# Patient Record
Sex: Male | Born: 1980 | Race: Black or African American | Hispanic: No | Marital: Single
Health system: Southern US, Community
[De-identification: ages and names within clinical notes are randomized; demographics above are authoritative.]

## PROBLEM LIST (undated history)

## (undated) DIAGNOSIS — I10 Essential (primary) hypertension: Secondary | ICD-10-CM

## (undated) DIAGNOSIS — W3400XA Accidental discharge from unspecified firearms or gun, initial encounter: Secondary | ICD-10-CM

## (undated) DIAGNOSIS — K5900 Constipation, unspecified: Secondary | ICD-10-CM

## (undated) DIAGNOSIS — R7303 Prediabetes: Secondary | ICD-10-CM

## (undated) HISTORY — DX: Constipation, unspecified: K59.00

## (undated) HISTORY — DX: Prediabetes: R73.03

## (undated) HISTORY — DX: Accidental discharge from unspecified firearms or gun, initial encounter: W34.00XA

---
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
## 2011-05-10 ENCOUNTER — Inpatient Hospital Stay (INDEPENDENT_AMBULATORY_CARE_PROVIDER_SITE_OTHER)
Admission: RE | Admit: 2011-05-10 | Discharge: 2011-05-10 | Disposition: A | Discharge status: Home / Self Care | Payer: Self-pay | Source: Ambulatory Visit | Attending: Emergency Medicine | Admitting: Emergency Medicine

## 2011-05-10 DIAGNOSIS — J029 Acute pharyngitis, unspecified: Secondary | ICD-10-CM

## 2011-05-10 DIAGNOSIS — J069 Acute upper respiratory infection, unspecified: Secondary | ICD-10-CM

## 2011-05-10 LAB — POCT RAPID STREP A: Streptococcus, Group A Screen (Direct): NEGATIVE

## 2016-12-30 ENCOUNTER — Encounter (HOSPITAL_COMMUNITY): Payer: Self-pay | Admitting: Emergency Medicine

## 2016-12-30 ENCOUNTER — Emergency Department (HOSPITAL_COMMUNITY)
Admission: EM | Admit: 2016-12-30 | Discharge: 2016-12-30 | Disposition: A | Discharge status: Home / Self Care | Payer: Self-pay | Attending: Emergency Medicine | Admitting: Emergency Medicine

## 2016-12-30 ENCOUNTER — Emergency Department (HOSPITAL_COMMUNITY): Payer: Self-pay

## 2016-12-30 DIAGNOSIS — F17200 Nicotine dependence, unspecified, uncomplicated: Secondary | ICD-10-CM | POA: Insufficient documentation

## 2016-12-30 DIAGNOSIS — Z720 Tobacco use: Secondary | ICD-10-CM

## 2016-12-30 DIAGNOSIS — R1110 Vomiting, unspecified: Secondary | ICD-10-CM | POA: Insufficient documentation

## 2016-12-30 DIAGNOSIS — R062 Wheezing: Secondary | ICD-10-CM | POA: Insufficient documentation

## 2016-12-30 DIAGNOSIS — J069 Acute upper respiratory infection, unspecified: Secondary | ICD-10-CM | POA: Insufficient documentation

## 2016-12-30 DIAGNOSIS — I10 Essential (primary) hypertension: Secondary | ICD-10-CM | POA: Insufficient documentation

## 2016-12-30 DIAGNOSIS — Z79899 Other long term (current) drug therapy: Secondary | ICD-10-CM | POA: Insufficient documentation

## 2016-12-30 HISTORY — DX: Essential (primary) hypertension: I10

## 2016-12-30 LAB — COMPREHENSIVE METABOLIC PANEL
ALBUMIN: 3.9 g/dL (ref 3.5–5.0)
ALT: 15 U/L — ABNORMAL LOW (ref 17–63)
ANION GAP: 10 (ref 5–15)
AST: 19 U/L (ref 15–41)
Alkaline Phosphatase: 73 U/L (ref 38–126)
BUN: 11 mg/dL (ref 6–20)
CO2: 27 mmol/L (ref 22–32)
Calcium: 9.2 mg/dL (ref 8.9–10.3)
Chloride: 102 mmol/L (ref 101–111)
Creatinine, Ser: 1.01 mg/dL (ref 0.61–1.24)
GFR calc Af Amer: >60 mL/min (ref >60)
GFR calc non Af Amer: >60 mL/min (ref >60)
GLUCOSE: 90 mg/dL (ref 65–99)
POTASSIUM: 3.6 mmol/L (ref 3.5–5.1)
SODIUM: 139 mmol/L (ref 135–145)
Total Bilirubin: 0.6 mg/dL (ref 0.3–1.2)
Total Protein: 7.9 g/dL (ref 6.5–8.1)

## 2016-12-30 LAB — CBC WITH DIFFERENTIAL/PLATELET
BASOS ABS: 0 10*3/uL (ref 0.0–0.1)
Basophils Relative: 0 %
EOS ABS: 0 10*3/uL (ref 0.0–0.7)
Eosinophils Relative: 0 %
HEMATOCRIT: 45.8 % (ref 39.0–52.0)
Hemoglobin: 15.3 g/dL (ref 13.0–17.0)
LYMPHS ABS: 3.3 10*3/uL (ref 0.7–4.0)
Lymphocytes Relative: 25 %
MCH: 29.9 pg (ref 26.0–34.0)
MCHC: 33.4 g/dL (ref 30.0–36.0)
MCV: 89.5 fL (ref 78.0–100.0)
MONOS PCT: 9 %
Monocytes Absolute: 1.2 10*3/uL — ABNORMAL HIGH (ref 0.1–1.0)
NEUTROS ABS: 8.8 10*3/uL — AB (ref 1.7–7.7)
Neutrophils Relative %: 66 %
PLATELETS: 311 10*3/uL (ref 150–400)
RBC: 5.12 MIL/uL (ref 4.22–5.81)
RDW: 14.5 % (ref 11.5–15.5)
WBC: 13.3 10*3/uL — ABNORMAL HIGH (ref 4.0–10.5)

## 2016-12-30 LAB — LIPASE, BLOOD: Lipase: 29 U/L (ref 11–51)

## 2016-12-30 LAB — I-STAT CG4 LACTIC ACID, ED: Lactic Acid, Venous: 1.22 mmol/L (ref 0.5–1.9)

## 2016-12-30 MED ORDER — SODIUM CHLORIDE 0.9 % IV BOLUS (SEPSIS)
1000.0000 mL | Freq: Once | INTRAVENOUS | Status: AC
  Administered 2016-12-30: 1000 mL via INTRAVENOUS

## 2016-12-30 MED ORDER — HYDROCOD POLST-CPM POLST ER 10-8 MG/5ML PO SUER
5.0000 mL | Freq: Once | ORAL | Status: AC
  Administered 2016-12-30: 5 mL via ORAL
  Filled 2016-12-30: qty 5

## 2016-12-30 MED ORDER — HYDROCODONE-ACETAMINOPHEN 5-325 MG PO TABS: ORAL_TABLET | ORAL | 0 refills | Status: DC

## 2016-12-30 MED ORDER — ALBUTEROL SULFATE (2.5 MG/3ML) 0.083% IN NEBU
5.0000 mg | INHALATION_SOLUTION | Freq: Once | RESPIRATORY_TRACT | Status: AC
  Administered 2016-12-30: 5 mg via RESPIRATORY_TRACT
  Filled 2016-12-30: qty 6

## 2016-12-30 MED ORDER — IBUPROFEN 400 MG PO TABS
400.0000 mg | ORAL_TABLET | Freq: Once | ORAL | Status: AC
  Administered 2016-12-30: 400 mg via ORAL
  Filled 2016-12-30: qty 1

## 2016-12-30 MED ORDER — ALBUTEROL SULFATE HFA 108 (90 BASE) MCG/ACT IN AERS
2.0000 | INHALATION_SPRAY | Freq: Once | RESPIRATORY_TRACT | Status: AC
  Administered 2016-12-30: 2 via RESPIRATORY_TRACT
  Filled 2016-12-30: qty 6.7

## 2016-12-30 NOTE — ED Triage Notes (Signed)
Pt arrives from home reporting productive cough x 5 days, fever/chills, sore throat and otalgia.  Pt reports n/v x 4 days.  Pt denies any family members with similar symptoms.

## 2016-12-30 NOTE — ED Notes (Signed)
IV removed.

## 2016-12-30 NOTE — ED Notes (Signed)
Ice water at bedside. Pt encouraged to drink.

## 2016-12-30 NOTE — ED Provider Notes (Signed)
[facility] DEPT Provider Note   CSN: [id number] Arrival date & time: 12/30/16  0707     History   Chief Complaint Chief Complaint  Patient presents with  . Cough  . Emesis    HPI   Blood pressure (!) 162/127, pulse 100, temperature 99.5 F (37.5 C), temperature source Oral, resp. rate (!) 31, height 6\' 1"  (1.854 m), weight 108.9 kg (240 lb), SpO2 93 %.  Derrick Duckingony M Sedgwick Jr. is a 36 y.o. male with past medical history significant for tobacco use and hypertension complaining of productive cough with shortness of breath wheezing, rhinorrhea, left otalgia, tactile fever, nausea and vomiting with no myalgia, diarrhea, abdominal pain. Symptoms started 4 days ago. Patient denies sick contacts. States that he vomited 7 times yesterday and he states that the emesis is posttussive.   Past Medical History:  Diagnosis Date  . Hypertension     There are no active problems to display for this patient.   History reviewed. No pertinent surgical history.     Home Medications    Prior to Admission medications   Medication Sig Start Date End Date Taking? Authorizing Provider  HYDROcodone-acetaminophen (NORCO/VICODIN) 5-325 MG tablet Take 1-2 tablets by mouth every 6 hours as needed for pain and/or cough. 12/30/16   Amilah Greenspan, Mardella LaymanNicole, PA-C    Family History No family history on file.  Social History Social History  Substance Use Topics  . Smoking status: Current Every Day Smoker    Packs/day: 2.00  . Smokeless tobacco: Never Used  . Alcohol use Yes     Comment: "I drink about a gallon of liquor most nights"     Allergies   Patient has no allergy information on record.   Review of Systems Review of Systems  10 systems reviewed and found to be negative, except as noted in the HPI.   Physical Exam Updated Vital Signs BP (!) 149/72 (BP Location: Left Arm)   Pulse 94   Temp 98.3 F (36.8 C) (Oral)   Resp 20   Ht 6\' 1"  (1.854 m)   Wt 108.9 kg (240 lb)   SpO2 99%    BMI 31.66 kg/m   Physical Exam  Constitutional: He appears well-developed and well-nourished.  HENT:  Head: Normocephalic.  Right Ear: External ear normal.  Left Ear: External ear normal.  Mouth/Throat: Oropharynx is clear and moist. No oropharyngeal exudate.  No drooling or stridor. Posterior pharynx mildly erythematous no significant tonsillar hypertrophy. No exudate. Soft palate rises symmetrically. No TTP or induration under tongue.   No tenderness to palpation of frontal or bilateral maxillary sinuses.  Mild mucosal edema in the nares with scant rhinorrhea.  Bilateral tympanic membranes with normal architecture and good light reflex. Left TM with very mild injection    Eyes: Conjunctivae and EOM are normal. Pupils are equal, round, and reactive to light.  Neck: Normal range of motion. Neck supple.  Cardiovascular: Normal rate and regular rhythm.   Pulmonary/Chest: Effort normal. No stridor. No respiratory distress. He has wheezes. He has no rales. He exhibits no tenderness.  Trace scattered expiratory wheezing  Abdominal: Soft. There is no tenderness. There is no rebound and no guarding.  Nursing note and vitals reviewed.    ED Treatments / Results  Labs (all labs ordered are listed, but only abnormal results are displayed) Labs Reviewed  CBC WITH DIFFERENTIAL/PLATELET - Abnormal; Notable for the following:       Result Value   WBC 13.3 (*)    Neutro  Abs 8.8 (*)    Monocytes Absolute 1.2 (*)    All other components within normal limits  COMPREHENSIVE METABOLIC PANEL - Abnormal; Notable for the following:    ALT 15 (*)    All other components within normal limits  LIPASE, BLOOD  I-STAT CG4 LACTIC ACID, ED    EKG  EKG Interpretation  Date/Time:  Monday December 30 2016 07:48:10 EDT Ventricular Rate:  91 PR Interval:    QRS Duration: 110 QT Interval:  352 QTC Calculation: 433 R Axis:   83 Text Interpretation:  Sinus rhythm Multiple premature complexes, vent &  supraven Probable left ventricular hypertrophy agree, no old comparison Confirmed by Arby Barrette [phone]) on 12/30/2016 8:34:17 AM       Radiology Dg Chest 2 View  Result Date: 12/30/2016 CLINICAL DATA:  Fever and cough for 4 days EXAM: CHEST  2 VIEW COMPARISON:  None. FINDINGS: Lungs are clear. Heart size and pulmonary vascularity are normal. No adenopathy. No bone lesions. IMPRESSION: No edema or consolidation. Electronically Signed   By: Bretta Bang III M.D.   On: 12/30/2016 09:17    Procedures Procedures (including critical care time)  Medications Ordered in ED Medications  albuterol (PROVENTIL HFA;VENTOLIN HFA) 108 (90 Base) MCG/ACT inhaler 2 puff (not administered)  albuterol (PROVENTIL) (2.5 MG/3ML) 0.083% nebulizer solution 5 mg (5 mg Nebulization Given 12/30/16 0801)  sodium chloride 0.9 % bolus 1,000 mL (0 mLs Intravenous Stopped 12/30/16 0938)  ibuprofen (ADVIL,MOTRIN) tablet 400 mg (400 mg Oral Given 12/30/16 0839)  chlorpheniramine-HYDROcodone (TUSSIONEX) 10-8 MG/5ML suspension 5 mL (5 mLs Oral Given 12/30/16 6045)     Initial Impression / Assessment and Plan / ED Course  I have reviewed the triage vital signs and the nursing notes.  Pertinent labs & imaging results that were available during my care of the patient were reviewed by me and considered in my medical decision making (see chart for details).     Vitals:   12/30/16 0830 12/30/16 0845 12/30/16 0948 12/30/16 1009  BP:  (!) 134/99 (!) 142/78 (!) 149/72  Pulse: 98 96 91 94  Resp: (!) 32 18 20 20   Temp:    98.3 F (36.8 C)  TempSrc:    Oral  SpO2: 95% 92% 92% 99%  Weight:      Height:        Medications  albuterol (PROVENTIL HFA;VENTOLIN HFA) 108 (90 Base) MCG/ACT inhaler 2 puff (not administered)  albuterol (PROVENTIL) (2.5 MG/3ML) 0.083% nebulizer solution 5 mg (5 mg Nebulization Given 12/30/16 0801)  sodium chloride 0.9 % bolus 1,000 mL (0 mLs Intravenous Stopped 12/30/16 0938)  ibuprofen  (ADVIL,MOTRIN) tablet 400 mg (400 mg Oral Given 12/30/16 0839)  chlorpheniramine-HYDROcodone (TUSSIONEX) 10-8 MG/5ML suspension 5 mL (5 mLs Oral Given 12/30/16 0838)    Suleyman Ehrman. is 36 y.o. male presenting with Cough, pleuritic chest pain, wheezing, otalgia and subjective fever. He also has multiple episodes of posttussive emesis. No respiratory distress. Vital signs with a mild tachypnea. Lung sounds improved after nebulizer treatment. Chest x-rays without infiltrate. Blood work reassuring with mild leukocytosis and normal lactic acid. Likely viral syndrome setting off reactive airway and posttussive emesis. Patient feels much better after hydration and Tussionex. Counseled him to control fever with ibuprofen and work note provided.  Extensive counseling on smoking cessation. Patient is given resource guide to establish primary care.  Evaluation does not show pathology that would require ongoing emergent intervention or inpatient treatment. Pt is hemodynamically stable and mentating appropriately.  Discussed findings and plan with patient/guardian, who agrees with care plan. All questions answered. Return precautions discussed and outpatient follow up given.    Final Clinical Impressions(s) / ED Diagnoses   Final diagnoses:  Upper respiratory tract infection, unspecified type  Wheezes  Post-tussive emesis  Tobacco use    New Prescriptions New Prescriptions   HYDROCODONE-ACETAMINOPHEN (NORCO/VICODIN) 5-325 MG TABLET    Take 1-2 tablets by mouth every 6 hours as needed for pain and/or cough.     Kaylyn Lim 12/30/16 1018    Arby Barrette, MD 12/31/16 [phone]

## 2016-12-30 NOTE — Discharge Instructions (Signed)

## 2017-01-06 ENCOUNTER — Emergency Department (HOSPITAL_COMMUNITY)
Admission: EM | Admit: 2017-01-06 | Discharge: 2017-01-06 | Disposition: A | Discharge status: Home / Self Care | Payer: Self-pay | Attending: Emergency Medicine | Admitting: Emergency Medicine

## 2017-01-06 ENCOUNTER — Encounter (HOSPITAL_COMMUNITY): Payer: Self-pay | Admitting: Emergency Medicine

## 2017-01-06 ENCOUNTER — Emergency Department (HOSPITAL_COMMUNITY): Payer: Self-pay

## 2017-01-06 DIAGNOSIS — Y92194 Driveway of other specified residential institution as the place of occurrence of the external cause: Secondary | ICD-10-CM | POA: Insufficient documentation

## 2017-01-06 DIAGNOSIS — S39012A Strain of muscle, fascia and tendon of lower back, initial encounter: Secondary | ICD-10-CM

## 2017-01-06 DIAGNOSIS — Y9389 Activity, other specified: Secondary | ICD-10-CM | POA: Insufficient documentation

## 2017-01-06 DIAGNOSIS — Y999 Unspecified external cause status: Secondary | ICD-10-CM | POA: Insufficient documentation

## 2017-01-06 DIAGNOSIS — I10 Essential (primary) hypertension: Secondary | ICD-10-CM | POA: Insufficient documentation

## 2017-01-06 DIAGNOSIS — F17200 Nicotine dependence, unspecified, uncomplicated: Secondary | ICD-10-CM | POA: Insufficient documentation

## 2017-01-06 DIAGNOSIS — Z79899 Other long term (current) drug therapy: Secondary | ICD-10-CM | POA: Insufficient documentation

## 2017-01-06 MED ORDER — OXYCODONE-ACETAMINOPHEN 5-325 MG PO TABS
1.0000 | ORAL_TABLET | Freq: Once | ORAL | Status: AC
  Administered 2017-01-06: 1 via ORAL
  Filled 2017-01-06: qty 1

## 2017-01-06 MED ORDER — OXYCODONE-ACETAMINOPHEN 5-325 MG PO TABS: 1.0000 | ORAL_TABLET | ORAL | 0 refills | Status: DC | PRN

## 2017-01-06 NOTE — ED Notes (Signed)
Pt c/o pain in left hip, left lumbar,left buttocks and left leg.

## 2017-01-06 NOTE — ED Notes (Signed)
ED Provider at bedside. 
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
## 2017-01-06 NOTE — Discharge Instructions (Signed)
Use ice on the sore area 3-4 times a day for 2 days, then heat.  See the doctor of your choice, as needed, for problems.
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
## 2017-09-22 ENCOUNTER — Encounter (HOSPITAL_COMMUNITY): Payer: Self-pay | Admitting: Emergency Medicine

## 2017-09-22 ENCOUNTER — Ambulatory Visit (HOSPITAL_COMMUNITY)
Admission: EM | Admit: 2017-09-22 | Discharge: 2017-09-22 | Disposition: A | Discharge status: Home / Self Care | Payer: Self-pay | Attending: Emergency Medicine | Admitting: Emergency Medicine

## 2017-09-22 ENCOUNTER — Other Ambulatory Visit: Payer: Self-pay

## 2017-09-22 DIAGNOSIS — F17210 Nicotine dependence, cigarettes, uncomplicated: Secondary | ICD-10-CM | POA: Insufficient documentation

## 2017-09-22 DIAGNOSIS — I10 Essential (primary) hypertension: Secondary | ICD-10-CM | POA: Insufficient documentation

## 2017-09-22 DIAGNOSIS — J029 Acute pharyngitis, unspecified: Secondary | ICD-10-CM | POA: Insufficient documentation

## 2017-09-22 LAB — POCT RAPID STREP A: STREPTOCOCCUS, GROUP A SCREEN (DIRECT): NEGATIVE

## 2017-09-22 MED ORDER — DEXAMETHASONE SODIUM PHOSPHATE 10 MG/ML IJ SOLN
10.0000 mg | Freq: Once | INTRAMUSCULAR | Status: AC
  Administered 2017-09-22: 10 mg via INTRAMUSCULAR

## 2017-09-22 MED ORDER — DEXAMETHASONE SODIUM PHOSPHATE 10 MG/ML IJ SOLN
INTRAMUSCULAR | Status: AC
  Filled 2017-09-22: qty 1

## 2017-09-22 MED ORDER — IBUPROFEN 600 MG PO TABS: 600.0000 mg | ORAL_TABLET | Freq: Four times a day (QID) | ORAL | 0 refills | Status: DC | PRN

## 2017-09-22 MED ORDER — AMOXICILLIN-POT CLAVULANATE 875-125 MG PO TABS: 1.0000 | ORAL_TABLET | Freq: Two times a day (BID) | ORAL | 0 refills | Status: DC

## 2017-09-22 NOTE — ED Provider Notes (Signed)
HPI  SUBJECTIVE:  Derrick Hull. is a 37 y.o. male who presents with 3 days of sore throat, chills, nasal congestion, rhinorrhea, body aches.  States that he feels like his throat is swelling shut.  He denies difficulty breathing, drooling, trismus.  He denies fevers, postnasal drip, cough, headache, rash, abdominal pain, allergy or GERD symptoms.  He states that his voice sounds a little more muffled than usual.  He tried Tylenol and an unknown cold medication without improvement in symptoms.  Symptoms are worse with swallowing, eating.  He states that his roommate has similar symptoms and was treated with an antibiotic.  Is not sure if his roommate is positive for strep.  No known exposure to mono.  He did also have a left upper wisdom tooth pulled 3 days prior to the symptoms starting, but he has been fine since then.  He has a past medical history of hypertension.  No history of diabetes, mono, recurrent strep.  PMD: None.    Past Medical History:  Diagnosis Date  . Hypertension     History reviewed. No pertinent surgical history.  Family History  Problem Relation Age of Onset  . Hypertension Mother     Social History   Tobacco Use  . Smoking status: Current Every Day Smoker    Packs/day: 2.00  . Smokeless tobacco: Never Used  Substance Use Topics  . Alcohol use: Yes    Comment: "I drink about a gallon of liquor most nights"  . Drug use: Yes    Frequency: 2.0 times per week    Types: Marijuana, Cocaine    No current facility-administered medications for this encounter.   Current Outpatient Medications:  .  amoxicillin-clavulanate (AUGMENTIN) 875-125 MG tablet, Take 1 tablet by mouth 2 (two) times daily., Disp: 20 tablet, Rfl: 0 .  ibuprofen (ADVIL,MOTRIN) 600 MG tablet, Take 1 tablet (600 mg total) by mouth every 6 (six) hours as needed., Disp: 30 tablet, Rfl: 0  No Known Allergies   ROS  As noted in HPI.   Physical Exam  BP (!) 149/63 (BP Location: Left Arm)  Comment (BP Location): large cuff  Pulse (!) 101   Temp 99.3 F (37.4 C) (Oral)   Resp (!) 24   SpO2 99%   Constitutional: Well developed, well nourished, no acute distress Eyes:  EOMI, conjunctiva normal bilaterally HENT: Normocephalic, atraumatic,mucus membranes moist.  No drooling, trismus.  Positive tender surgical site left upper wisdom tooth area.  Positive erythema oropharynx.  Tonsils enlarged on the left more so than the right.  No exudates.  Uvula midline.  No soft palate tenderness.  Neck: Positive cervical lymphadenopathy  Respiratory: Normal inspiratory effort Cardiovascular: Regular tachycardia no murmurs GI: nondistended, no splenomegaly skin: No rash, skin intact Musculoskeletal: no deformities Neurologic: Alert & oriented x 3, no focal neuro deficits Psychiatric: Speech and behavior appropriate   ED Course   Medications  dexamethasone (DECADRON) injection 10 mg (10 mg Intramuscular Given 09/22/17 1826)    Orders Placed This Encounter  Procedures  . Culture, group A strep    Standing Status:   Standing    Number of Occurrences:   1  . POCT rapid strep A [facility])    Standing Status:   Standing    Number of Occurrences:   1    No results found for this or any previous visit (from the past 24 hour(s)). No results found.  ED Clinical Impression  Acute pharyngitis, unspecified etiology  ED Assessment/Plan  Rapid strep negative, however because of the recent dental work, this could still be an early peritonsillar abscess versus dental abscess.  Does not appear to be anything to drain at this point in time.  Giving shot of dexamethasone 10 mg IM for the tonsillar swelling.  Will send home with Augmentin for 10 days will cover both early peritonsillar abscess and dental infection.  He will return here in 36-48 hours if he still has a sore throat, gave patient strict ER return precautions.  Discussed labs, MDM, plan and followup with patient. Discussed  sn/sx that should prompt return to the ED. patient agrees with plan.   Meds ordered this encounter  Medications  . dexamethasone (DECADRON) injection 10 mg  . amoxicillin-clavulanate (AUGMENTIN) 875-125 MG tablet    Sig: Take 1 tablet by mouth 2 (two) times daily.    Dispense:  20 tablet    Refill:  0  . ibuprofen (ADVIL,MOTRIN) 600 MG tablet    Sig: Take 1 tablet (600 mg total) by mouth every 6 (six) hours as needed.    Dispense:  30 tablet    Refill:  0    *This clinic note was created using Scientist, clinical (histocompatibility and immunogenetics)Dragon dictation software. Therefore, there may be occasional mistakes despite careful proofreading.   ?   Domenick GongMortenson, Erubiel Manasco, MD 09/24/17 1339

## 2017-09-22 NOTE — ED Triage Notes (Signed)
Onset of symptoms was 3 days ago. Symptoms include chills, hot flashes, headache, chest soreness, sore throat and minimal cough

## 2017-09-22 NOTE — Discharge Instructions (Signed)
Follow-up here if not getting better in 36-48 hours.  Go immediately to the ER if you get worse.  1 gram of Tylenol and 600 mg ibuprofen together 3-4 times a day as needed for pain.  Make sure you drink plenty of extra fluids.  Some people find salt water gargles and  Traditional Medicinal's "Throat Coat" tea helpful. Take 5 mL of liquid Benadryl and 5 mL of Maalox. Mix it together, and then hold it in your mouth for as long as you can and then swallow. You may do this 4 times a day.  Up with a primary care physician of your choice for routine care.  See list below.  Below is a list of primary care practices who are taking new patients for you to follow-up with. [facility] [address] [place] [address] [phone]  [facility] [phone] [address] [place] [address]  [facility]: [address] [place] [id number]  [phone]  [facility]: [address] [place] [id number]   [phone]  [facility]: [address] [place] [address]  [phone]  [facility] : [address] [place] [address] [phone]  [facility]: [address]  [place] [phone]  [facility]: [address]  [place] [id number] [phone]  Dr. Oneal GroutMahima Pandey [address] [facility] [address] [facility] [place] [id number]  [phone]  Dr. Jackie PlumGeorge Osei-Bonsu, [facility]. [address] [place], [place] [address]  [phone]  Go to www.goodrx.com to look up your medications. This will give you a list of where you can find your prescriptions at the most affordable prices. Or ask the pharmacist what the cash price is, or if they have any  other discount programs available to help make your medication more affordable. This can be less expensive than what you would pay with insurance.

## 2017-09-24 LAB — CULTURE, GROUP A STREP (THRC)

## 2017-10-14 ENCOUNTER — Other Ambulatory Visit: Payer: Self-pay

## 2017-10-14 ENCOUNTER — Encounter (HOSPITAL_COMMUNITY): Payer: Self-pay | Admitting: Emergency Medicine

## 2017-10-14 ENCOUNTER — Ambulatory Visit (HOSPITAL_COMMUNITY)
Admission: EM | Admit: 2017-10-14 | Discharge: 2017-10-14 | Disposition: A | Discharge status: Home / Self Care | Payer: Self-pay | Attending: Family Medicine | Admitting: Family Medicine

## 2017-10-14 DIAGNOSIS — Z202 Contact with and (suspected) exposure to infections with a predominantly sexual mode of transmission: Secondary | ICD-10-CM

## 2017-10-14 DIAGNOSIS — Z113 Encounter for screening for infections with a predominantly sexual mode of transmission: Secondary | ICD-10-CM

## 2017-10-14 DIAGNOSIS — F17210 Nicotine dependence, cigarettes, uncomplicated: Secondary | ICD-10-CM | POA: Insufficient documentation

## 2017-10-14 DIAGNOSIS — A599 Trichomoniasis, unspecified: Secondary | ICD-10-CM | POA: Insufficient documentation

## 2017-10-14 DIAGNOSIS — Z711 Person with feared health complaint in whom no diagnosis is made: Secondary | ICD-10-CM

## 2017-10-14 DIAGNOSIS — I10 Essential (primary) hypertension: Secondary | ICD-10-CM | POA: Insufficient documentation

## 2017-10-14 DIAGNOSIS — Z8249 Family history of ischemic heart disease and other diseases of the circulatory system: Secondary | ICD-10-CM | POA: Insufficient documentation

## 2017-10-14 MED ORDER — METRONIDAZOLE 500 MG PO TABS: 500.0000 mg | ORAL_TABLET | Freq: Two times a day (BID) | ORAL | 0 refills | Status: DC

## 2017-10-14 NOTE — ED Triage Notes (Signed)
[facility] with a complaint of being exposed to Trichomonas. The patient reported that his partner was diagnosed with the same. The patient reported dysuria previously but currently no symptoms. Dirty urine sample collected.

## 2017-10-14 NOTE — Discharge Instructions (Addendum)
We have sent testing for sexually transmitted infections. We will notify you of any positive results once they are received. If required, we will prescribe any medications you might need.  Please refrain from all sexual activity for at least the next seven days.  

## 2017-10-15 LAB — URINE CYTOLOGY ANCILLARY ONLY
Chlamydia: NEGATIVE
NEISSERIA GONORRHEA: NEGATIVE
TRICH (WINDOWPATH): NEGATIVE

## 2017-10-15 NOTE — ED Provider Notes (Signed)
[facility]   [id number] 10/14/17 Arrival Time: 1824  ASSESSMENT & PLAN:  1. Concern about STD in male without diagnosis   2. Trichomoniasis     Meds ordered this encounter  Medications  . metroNIDAZOLE (FLAGYL) 500 MG tablet    Sig: Take 1 tablet (500 mg total) by mouth 2 (two) times daily.    Dispense:  14 tablet    Refill:  0   Pending: Labs Reviewed  URINE CYTOLOGY ANCILLARY ONLY   Will notify of any positive results. Instructed to refrain from sexual activity for at least seven days.  Reviewed expectations re: course of current medical issues. Questions answered. Outlined signs and symptoms indicating need for more acute intervention. Patient verbalized understanding. After Visit Summary given.   SUBJECTIVE:  Derrick Duckingony M Grenfell Jr. is a 37 y.o. male who presents reporting that his male sexual partner was dx with trich. Desires treatment and testing. He is without symptoms. Afebrile. No abdominal or pelvic pain. No n/v. No rashes or lesions.  ROS: As per HPI.  OBJECTIVE:  Vitals:   10/14/17 1933  BP: (!) 149/91  Pulse: 85  Resp: 18  Temp: 98.3 F (36.8 C)  TempSrc: Oral  SpO2: 98%     General appearance: alert, cooperative, appears stated age and no distress Throat: lips, mucosa, and tongue normal; teeth and gums normal Abdomen: soft, non-tender Skin: warm and dry Psychological:  Alert and cooperative. Normal mood and affect.   Labs Reviewed  URINE CYTOLOGY ANCILLARY ONLY   No Known Allergies  Past Medical History:  Diagnosis Date  . Hypertension    Family History  Problem Relation Age of Onset  . Hypertension Mother    Social History   Socioeconomic History  . Marital status: Single    Spouse name: Not on file  . Number of children: Not on file  . Years of education: Not on file  . Highest education level: Not on file  Occupational History  . Not on file  Social Needs  . Financial resource strain: Not on file  . Food  insecurity:    Worry: Not on file    Inability: Not on file  . Transportation needs:    Medical: Not on file    Non-medical: Not on file  Tobacco Use  . Smoking status: Current Every Day Smoker    Packs/day: 2.00  . Smokeless tobacco: Never Used  Substance and Sexual Activity  . Alcohol use: Yes    Comment: "I drink about a gallon of liquor most nights"  . Drug use: Yes    Frequency: 2.0 times per week    Types: Marijuana, Cocaine  . Sexual activity: Not on file  Lifestyle  . Physical activity:    Days per week: Not on file    Minutes per session: Not on file  . Stress: Not on file  Relationships  . Social connections:    Talks on phone: Not on file    Gets together: Not on file    Attends religious service: Not on file    Active member of club or organization: Not on file    Attends meetings of clubs or organizations: Not on file    Relationship status: Not on file  . Intimate partner violence:    Fear of current or ex partner: Not on file    Emotionally abused: Not on file    Physically abused: Not on file    Forced sexual activity: Not on file  Other  Topics Concern  . Not on file  Social History Narrative  . Not on file          Mardella Layman, MD 10/15/17 [phone]

## 2018-09-14 IMAGING — DX DG CHEST 2V
2 series · 2 of 2 positions shown · non-contrast
Comparison: None.

CLINICAL DATA: Fever and cough for 4 days

EXAM:
CHEST  2 VIEW

[w chest pa]
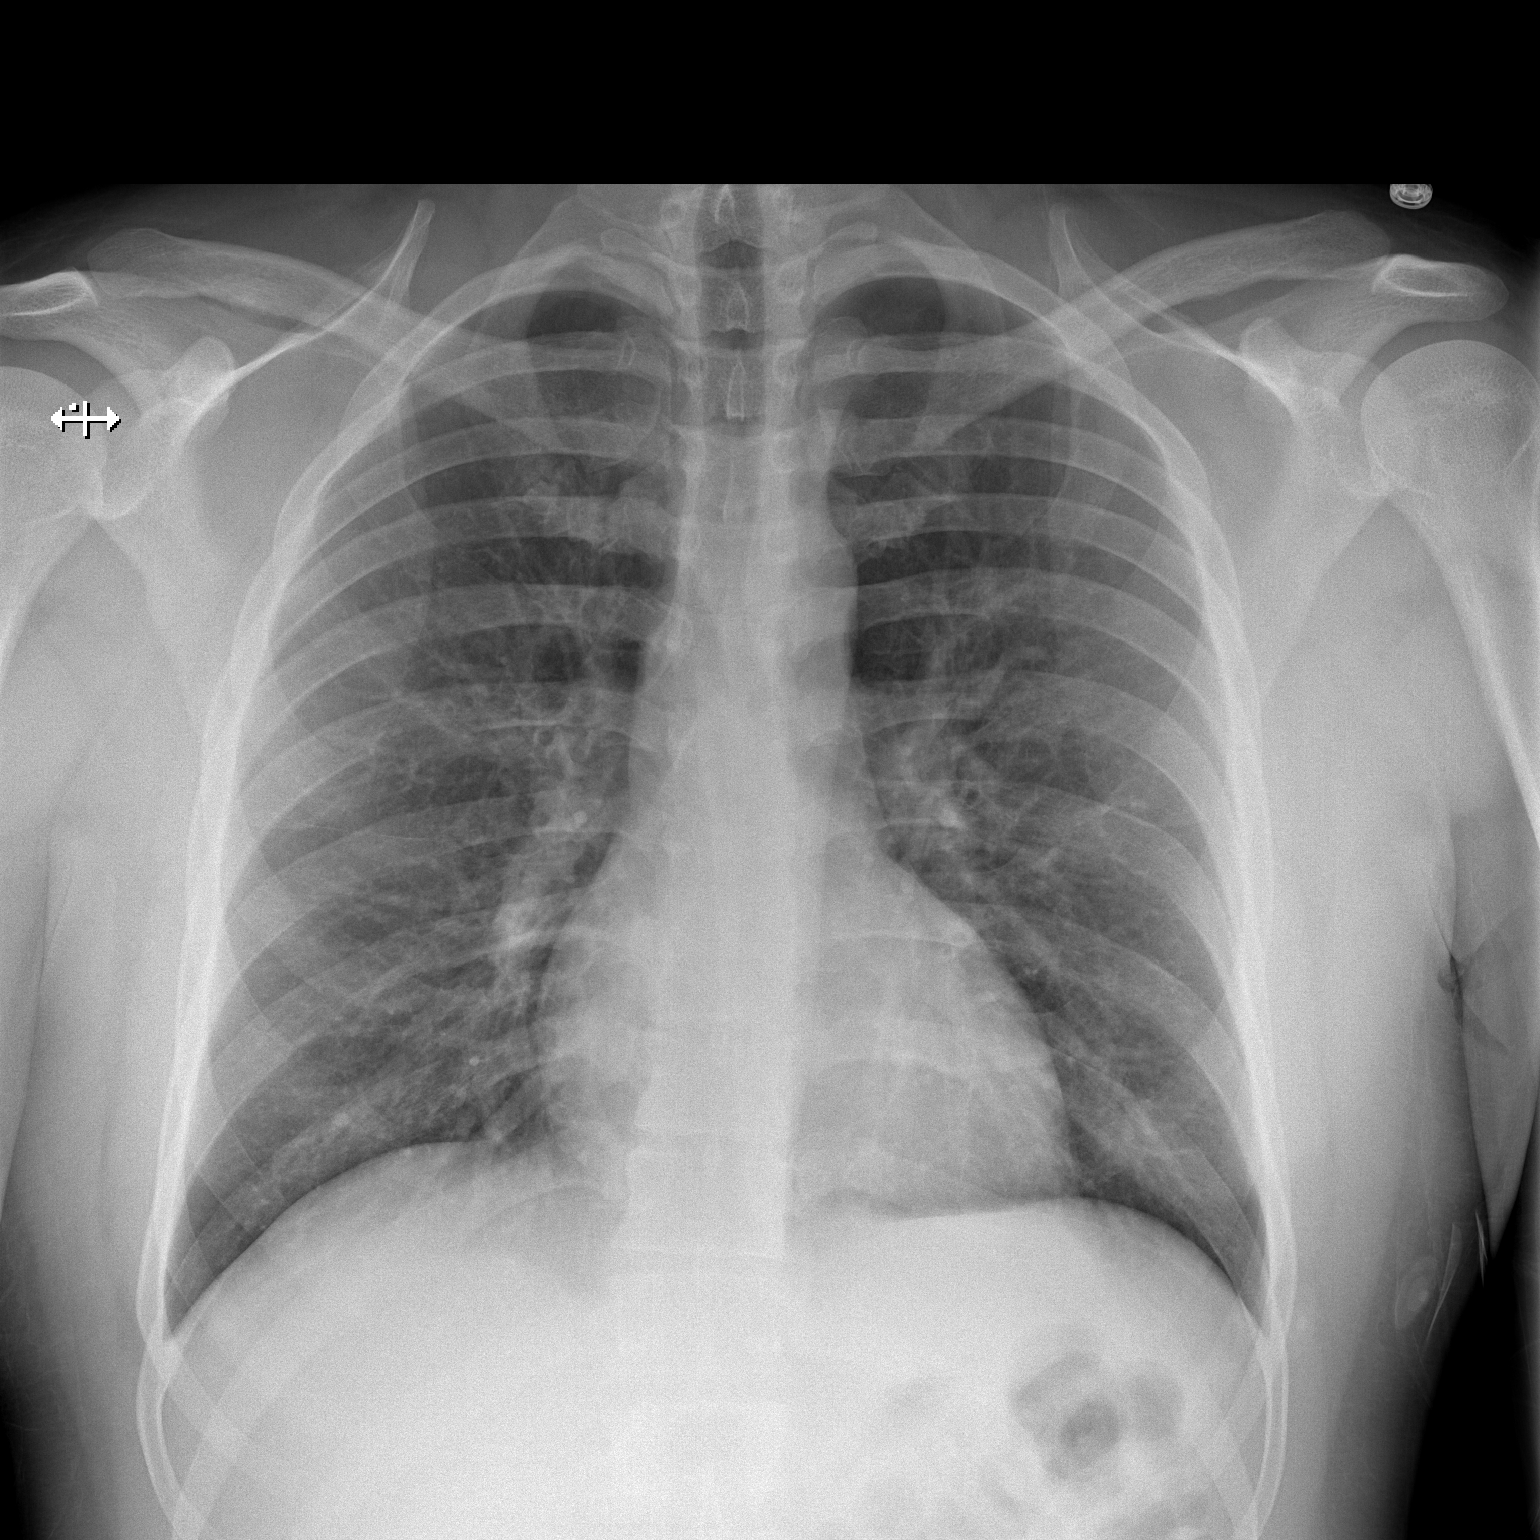

[w chest lat]
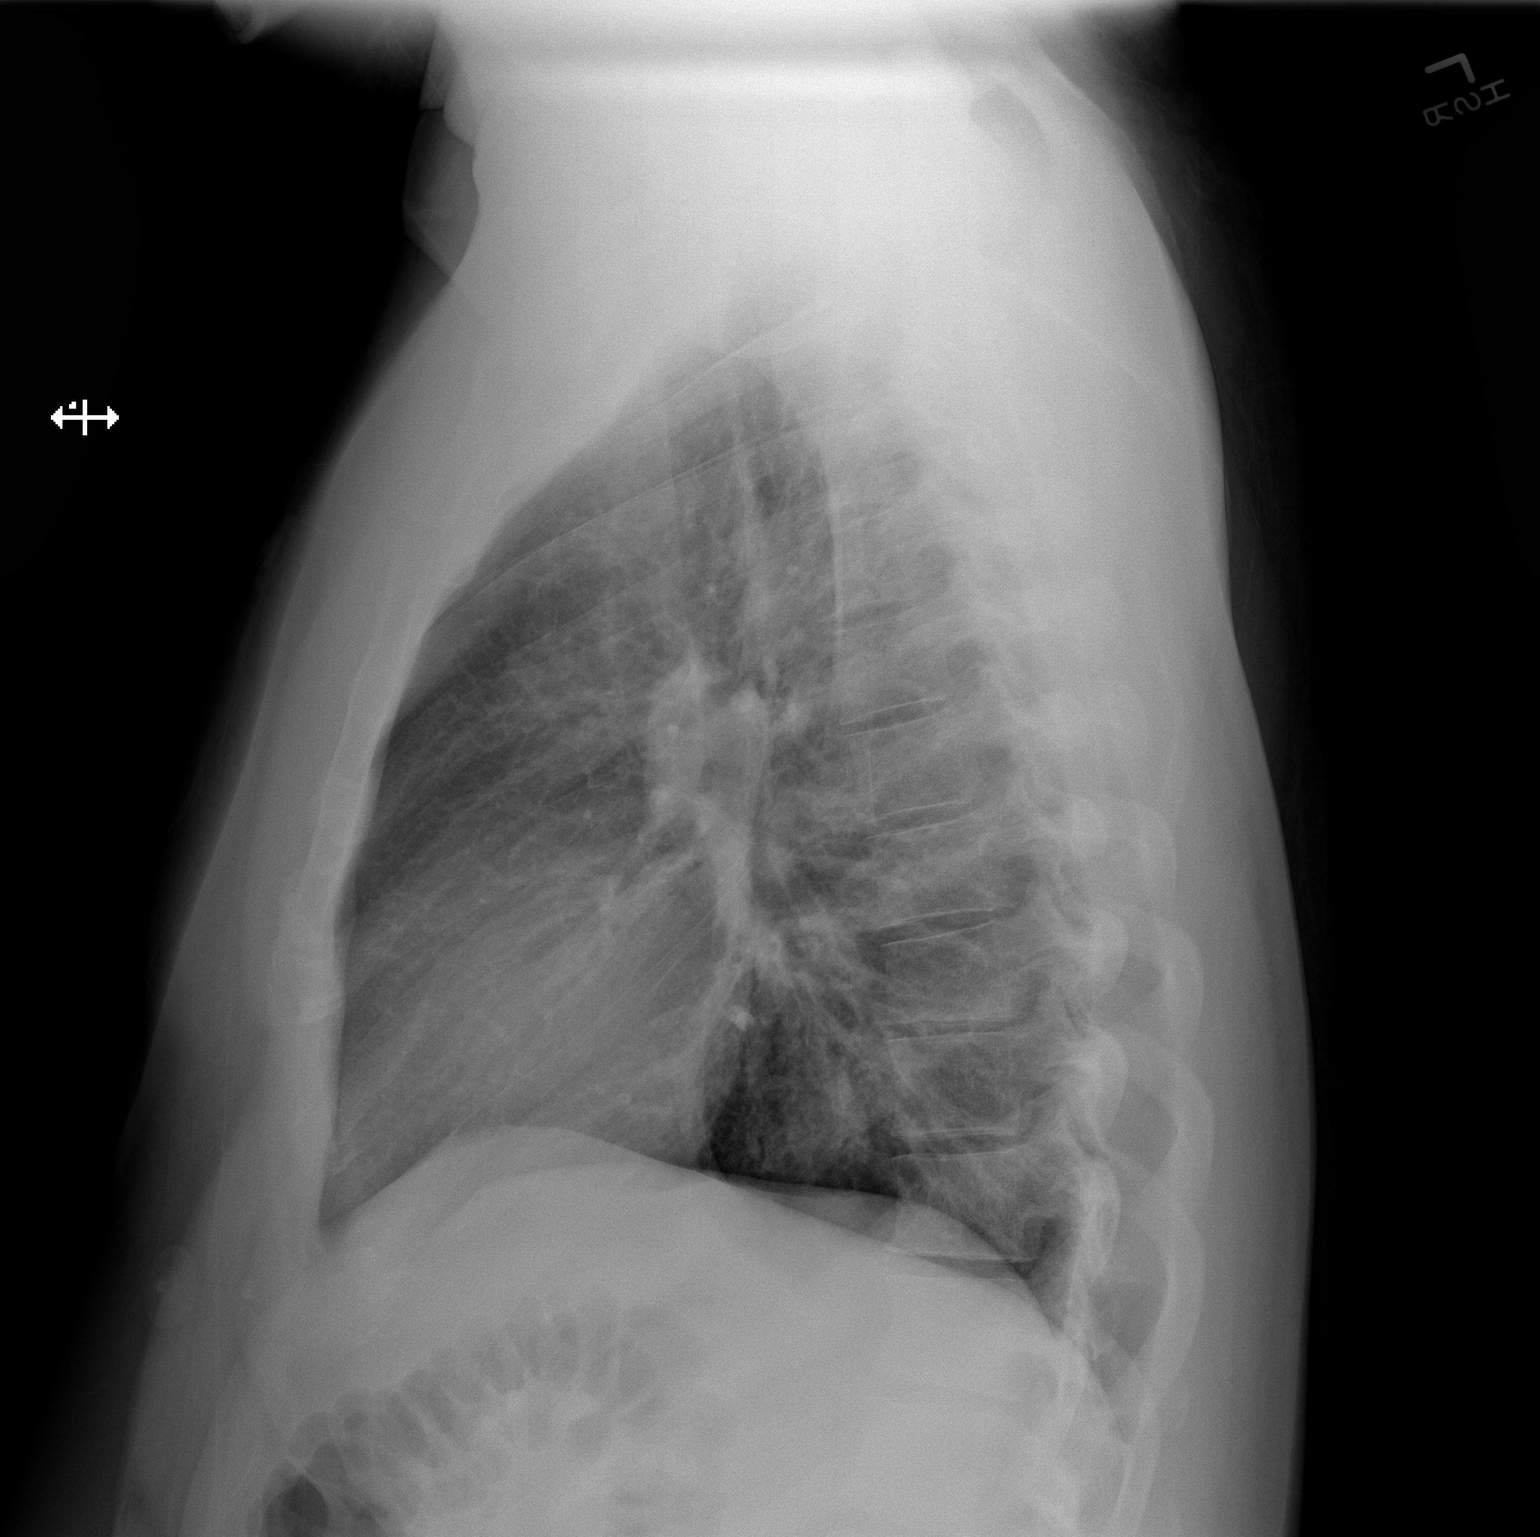

[2 of 2 positions shown; findings below may reference images not displayed]

FINDINGS: Lungs are clear. Heart size and pulmonary vascularity are normal. No
adenopathy. No bone lesions.
IMPRESSION: No edema or consolidation.

## 2020-05-16 ENCOUNTER — Emergency Department (HOSPITAL_COMMUNITY)
Admission: EM | Admit: 2020-05-16 | Discharge: 2020-05-16 | Disposition: A | Discharge status: Home / Self Care | Payer: No Typology Code available for payment source | Attending: Emergency Medicine | Admitting: Emergency Medicine

## 2020-05-16 ENCOUNTER — Emergency Department (HOSPITAL_COMMUNITY): Payer: No Typology Code available for payment source

## 2020-05-16 ENCOUNTER — Other Ambulatory Visit: Payer: Self-pay

## 2020-05-16 ENCOUNTER — Encounter (HOSPITAL_COMMUNITY): Payer: Self-pay

## 2020-05-16 DIAGNOSIS — F17200 Nicotine dependence, unspecified, uncomplicated: Secondary | ICD-10-CM | POA: Diagnosis not present

## 2020-05-16 DIAGNOSIS — I10 Essential (primary) hypertension: Secondary | ICD-10-CM | POA: Diagnosis not present

## 2020-05-16 DIAGNOSIS — S3992XA Unspecified injury of lower back, initial encounter: Secondary | ICD-10-CM | POA: Diagnosis present

## 2020-05-16 DIAGNOSIS — S39012A Strain of muscle, fascia and tendon of lower back, initial encounter: Secondary | ICD-10-CM | POA: Insufficient documentation

## 2020-05-16 MED ORDER — ORPHENADRINE CITRATE ER 100 MG PO TB12: 100.0000 mg | ORAL_TABLET | Freq: Two times a day (BID) | ORAL | 0 refills | Status: AC

## 2020-05-16 MED ORDER — NAPROXEN 500 MG PO TABS: 500.0000 mg | ORAL_TABLET | Freq: Two times a day (BID) | ORAL | 0 refills | Status: AC

## 2020-05-16 NOTE — ED Notes (Signed)
Reviewed discharge instructions with patient. Follow-up care and medications reviewed. Patient  verbalized understanding. Patient A&Ox4, VSS, and ambulatory with steady gait upon discharge.  °

## 2020-05-16 NOTE — Discharge Instructions (Addendum)
Follow up with your primary care provider. Take Naproxen as needed as prescribed for pain. Take Norflex as needed as prescribed for muscle spasm. Do not drive or operate machinery if taking Norflex. Warm compresses to sore muscles followed by gentle stretching.
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
## 2020-05-22 DEATH — deceased

## 2021-11-21 ENCOUNTER — Emergency Department (HOSPITAL_COMMUNITY)
Admission: EM | Admit: 2021-11-21 | Discharge: 2021-11-22 | Disposition: A | Discharge status: Home / Self Care | Payer: Medicaid Other | Attending: Emergency Medicine | Admitting: Emergency Medicine

## 2021-11-21 ENCOUNTER — Other Ambulatory Visit: Payer: Self-pay

## 2021-11-21 DIAGNOSIS — D72829 Elevated white blood cell count, unspecified: Secondary | ICD-10-CM | POA: Diagnosis not present

## 2021-11-21 DIAGNOSIS — H6123 Impacted cerumen, bilateral: Secondary | ICD-10-CM | POA: Insufficient documentation

## 2021-11-21 DIAGNOSIS — Z20822 Contact with and (suspected) exposure to covid-19: Secondary | ICD-10-CM | POA: Insufficient documentation

## 2021-11-21 DIAGNOSIS — I10 Essential (primary) hypertension: Secondary | ICD-10-CM | POA: Insufficient documentation

## 2021-11-21 DIAGNOSIS — J069 Acute upper respiratory infection, unspecified: Secondary | ICD-10-CM | POA: Diagnosis not present

## 2021-11-21 DIAGNOSIS — J029 Acute pharyngitis, unspecified: Secondary | ICD-10-CM

## 2021-11-21 DIAGNOSIS — R059 Cough, unspecified: Secondary | ICD-10-CM | POA: Diagnosis present

## 2021-11-21 NOTE — ED Triage Notes (Signed)
Pt reports with sore throat, swollen tonsils, and cough x 3 weeks.  ?

## 2021-11-22 ENCOUNTER — Emergency Department (HOSPITAL_COMMUNITY): Payer: Medicaid Other

## 2021-11-22 ENCOUNTER — Telehealth (HOSPITAL_COMMUNITY): Payer: Self-pay | Admitting: Physician Assistant

## 2021-11-22 ENCOUNTER — Encounter (HOSPITAL_COMMUNITY): Payer: Self-pay

## 2021-11-22 LAB — CBC WITH DIFFERENTIAL/PLATELET
Abs Immature Granulocytes: 0.07 10*3/uL (ref 0.00–0.07)
Basophils Absolute: 0.1 10*3/uL (ref 0.0–0.1)
Basophils Relative: 1 %
Eosinophils Absolute: 0 10*3/uL (ref 0.0–0.5)
Eosinophils Relative: 0 %
HCT: 51.2 % (ref 39.0–52.0)
Hemoglobin: 16.7 g/dL (ref 13.0–17.0)
Immature Granulocytes: 1 %
Lymphocytes Relative: 24 %
Lymphs Abs: 3.5 10*3/uL (ref 0.7–4.0)
MCH: 28.8 pg (ref 26.0–34.0)
MCHC: 32.6 g/dL (ref 30.0–36.0)
MCV: 88.3 fL (ref 80.0–100.0)
Monocytes Absolute: 1.1 10*3/uL — ABNORMAL HIGH (ref 0.1–1.0)
Monocytes Relative: 8 %
Neutro Abs: 10 10*3/uL — ABNORMAL HIGH (ref 1.7–7.7)
Neutrophils Relative %: 66 %
Platelets: 386 10*3/uL (ref 150–400)
RBC: 5.8 MIL/uL (ref 4.22–5.81)
RDW: 14.6 % (ref 11.5–15.5)
WBC: 14.8 10*3/uL — ABNORMAL HIGH (ref 4.0–10.5)
nRBC: 0 % (ref 0.0–0.2)

## 2021-11-22 LAB — RESP PANEL BY RT-PCR (FLU A&B, COVID) ARPGX2
Influenza A by PCR: NEGATIVE
Influenza B by PCR: NEGATIVE
SARS Coronavirus 2 by RT PCR: NEGATIVE

## 2021-11-22 LAB — I-STAT CHEM 8, ED
BUN: 12 mg/dL (ref 6–20)
Calcium, Ion: 1.16 mmol/L (ref 1.15–1.40)
Chloride: 99 mmol/L (ref 98–111)
Creatinine, Ser: 1.1 mg/dL (ref 0.61–1.24)
Glucose, Bld: 114 mg/dL — ABNORMAL HIGH (ref 70–99)
HCT: 52 % (ref 39.0–52.0)
Hemoglobin: 17.7 g/dL — ABNORMAL HIGH (ref 13.0–17.0)
Potassium: 4.2 mmol/L (ref 3.5–5.1)
Sodium: 141 mmol/L (ref 135–145)
TCO2: 35 mmol/L — ABNORMAL HIGH (ref 22–32)

## 2021-11-22 LAB — GROUP A STREP BY PCR: Group A Strep by PCR: NOT DETECTED

## 2021-11-22 MED ORDER — AMOXICILLIN-POT CLAVULANATE 875-125 MG PO TABS: 1.0000 | ORAL_TABLET | Freq: Two times a day (BID) | ORAL | 0 refills | Status: DC

## 2021-11-22 MED ORDER — SODIUM CHLORIDE 0.9 % IV BOLUS
1000.0000 mL | Freq: Once | INTRAVENOUS | Status: AC
  Administered 2021-11-22: 1000 mL via INTRAVENOUS

## 2021-11-22 MED ORDER — METHYLPREDNISOLONE SODIUM SUCC 125 MG IJ SOLR
125.0000 mg | Freq: Once | INTRAMUSCULAR | Status: AC
  Administered 2021-11-22: 125 mg via INTRAVENOUS
  Filled 2021-11-22: qty 2

## 2021-11-22 MED ORDER — SODIUM CHLORIDE (PF) 0.9 % IJ SOLN
INTRAMUSCULAR | Status: AC
  Filled 2021-11-22: qty 50

## 2021-11-22 MED ORDER — IOHEXOL 300 MG/ML  SOLN
75.0000 mL | Freq: Once | INTRAMUSCULAR | Status: AC | PRN
  Administered 2021-11-22: 75 mL via INTRAVENOUS

## 2021-11-22 MED ORDER — AMOXICILLIN-POT CLAVULANATE 875-125 MG PO TABS
1.0000 | ORAL_TABLET | Freq: Once | ORAL | Status: AC
  Administered 2021-11-22: 1 via ORAL
  Filled 2021-11-22: qty 1

## 2021-11-22 NOTE — ED Notes (Signed)
Pt requesting antibiotic prescription be resent to [facility] on [address]. The [facility] on [address] does not have his script, AVS shows pt was prescribed an antibiotic. [place], [place] states he will resend the script for antibiotics to [facility] on [address].  ?

## 2021-11-22 NOTE — ED Provider Notes (Signed)
?[facility]-EMERGENCY DEPT ?Provider Note ? ? ?CSN: [id number] ?Arrival date & time: 11/21/21  2331 ? ?  ? ?History ? ?Chief Complaint  ?Patient presents with  ? Sore Throat  ? Cough  ? ? ?Derrick Hull. is a 41 y.o. male. ? ?41 year old male presents today for evaluation of cough, sore throat of 3-week duration that is worsened recently involving hoarseness of his voice, difficulty swallowing.  He states he is able to tolerate his own secretions however is becoming increasingly difficult to to maintain his secretions.  Endorses subjective fevers at home.  Last p.o. intake 2 hours ago.  Endorses productive sputum. ? ?The history is provided by the patient. No language interpreter was used.  ? ?  ? ?Home Medications ?Prior to Admission medications   ?Medication Sig Start Date End Date Taking? Authorizing Provider  ?naproxen (NAPROSYN) 500 MG tablet Take 1 tablet (500 mg total) by mouth 2 (two) times daily. 05/16/20   Jeannie Fend, PA-C  ?orphenadrine (NORFLEX) 100 MG tablet Take 1 tablet (100 mg total) by mouth 2 (two) times daily. 05/16/20   Jeannie Fend, PA-C  ?   ? ?Allergies    ?Patient has no known allergies.   ? ?Review of Systems   ?Review of Systems  ?Constitutional:  Positive for chills and fever.  ?HENT:  Positive for congestion, sore throat, trouble swallowing and voice change. Negative for drooling.   ?Respiratory:  Positive for cough. Negative for shortness of breath.   ?Cardiovascular:  Negative for chest pain.  ?Gastrointestinal:  Positive for nausea and vomiting.  ?Neurological:  Negative for weakness and light-headedness.  ?All other systems reviewed and are negative. ? ?Physical Exam ?Updated Vital Signs ?BP (!) 142/109   Pulse 98   Temp 99.1 ?F (37.3 ?C) (Oral)   Resp 17   Ht 6\' 2"  (1.88 m)   Wt 116.1 kg   SpO2 93%   BMI 32.87 kg/m?  ?Physical Exam ?Vitals and nursing note reviewed.  ?Constitutional:   ?   General: He is not in acute distress. ?   Appearance:  Normal appearance. He is not ill-appearing.  ?HENT:  ?   Head: Normocephalic and atraumatic.  ?   Right Ear: There is impacted cerumen.  ?   Left Ear: There is impacted cerumen.  ?   Nose: Nose normal.  ?   Mouth/Throat:  ?   Pharynx: Uvula midline. Posterior oropharyngeal erythema present. No oropharyngeal exudate.  ?   Comments: Uniform tonsillar swelling present.  Uvula midline.  No evidence of retropharyngeal abscess or peritonsillar abscess. ?Eyes:  ?   General: No scleral icterus. ?   Extraocular Movements: Extraocular movements intact.  ?   Conjunctiva/sclera: Conjunctivae normal.  ?Cardiovascular:  ?   Rate and Rhythm: Normal rate and regular rhythm.  ?   Pulses: Normal pulses.  ?   Heart sounds: Normal heart sounds.  ?Pulmonary:  ?   Effort: Pulmonary effort is normal. No respiratory distress.  ?   Breath sounds: Normal breath sounds. No wheezing or rales.  ?Abdominal:  ?   General: There is no distension.  ?   Tenderness: There is no abdominal tenderness.  ?Musculoskeletal:     ?   General: Normal range of motion.  ?   Cervical back: Normal range of motion.  ?Skin: ?   General: Skin is warm and dry.  ?Neurological:  ?   General: No focal deficit present.  ?   Mental Status:  He is alert. Mental status is at baseline.  ? ? ?ED Results / Procedures / Treatments   ?Labs ?(all labs ordered are listed, but only abnormal results are displayed) ?Labs Reviewed  ?GROUP A STREP BY PCR  ?RESP PANEL BY RT-PCR (FLU A&B, COVID) ARPGX2  ?CBC WITH DIFFERENTIAL/PLATELET  ?I-STAT CHEM 8, ED  ? ? ?EKG ?None ? ?Radiology ?No results found. ? ?Procedures ?Procedures  ? ? ?Medications Ordered in ED ?Medications  ?sodium chloride 0.9 % bolus 1,000 mL (has no administration in time range)  ? ? ?ED Course/ Medical Decision Making/ A&P ?  ?                        ?Medical Decision Making ?Amount and/or Complexity of Data Reviewed ?Labs: ordered. ?Radiology: ordered. ? ?Risk ?Prescription drug management. ? ? ?Medical Decision Making  / ED Course ? ? ?This patient presents to the ED for concern of sore throat, cough, this involves an extensive number of treatment options, and is a complaint that carries with it a high risk of complications and morbidity.  The differential diagnosis includes pneumonia, retropharyngeal abscess, Ludwick's angina, peritonsillar abscess, tonsillitis, strep pharyngitis, URI ? ?MDM: ?41 year old male presents today for evaluation of sore throat, cough of the past 3 weeks that has been significantly worsening as of late including hoarseness, difficulty swallowing, fever.  Tolerating his own secretions.  Last p.o. intake 2 hours ago.  Will evaluate with CT soft tissue neck, CBC, i-STAT chemistry panel, and chest x-ray.  Will provide IV fluids in the meantime.  Patient on exam appears to be comfortable however does have hoarseness.  ?CT soft tissue neck shows tonsillopharyngitis otherwise no concern for abscess.  Chest x-ray negative for pneumonia or other acute findings.  CBC shows leukocytosis.  Renal function preserved.  COVID and flu negative.  Strep negative.  Will discharge on Augmentin.  Will provide dose of Augmentin and Solu-Medrol in the emergency room.  He has been tolerating p.o. intake in the emergency room.  Patient is ? ? ?Lab Tests: ?-I ordered, reviewed, and interpreted labs.   ?The pertinent results include:   ?Labs Reviewed  ?GROUP A STREP BY PCR  ?RESP PANEL BY RT-PCR (FLU A&B, COVID) ARPGX2  ?CBC WITH DIFFERENTIAL/PLATELET  ?I-STAT CHEM 8, ED  ?  ? ? ?EKG ? EKG Interpretation ? ?Date/Time:    ?Ventricular Rate:    ?PR Interval:    ?QRS Duration:   ?QT Interval:    ?QTC Calculation:   ?R Axis:     ?Text Interpretation:   ?  ? ?  ? ? ? ?Imaging Studies ordered: ?I ordered imaging studies including CT soft tissue neck, chest x-ray ?I independently visualized and interpreted imaging. ?I agree with the radiologist interpretation ? ? ?Medicines ordered and prescription drug management: ?Meds ordered this  encounter  ?Medications  ? sodium chloride 0.9 % bolus 1,000 mL  ?  ?-I have reviewed the patients home medicines and have made adjustments as needed ? ?Reevaluation: ?After the interventions noted above, I reevaluated the patient and found that they have :improved ? ?Co morbidities that complicate the patient evaluation ? ?Past Medical History:  ?Diagnosis Date  ? Hypertension   ?  ? ? ?Dispostion: ?Patient is appropriate for discharge.  Discharged in stable condition.  Return precautions discussed. ? ?Final Clinical Impression(s) / ED Diagnoses ?Final diagnoses:  ?Tonsillopharyngitis  ? ? ?Rx / DC Orders ?ED Discharge Orders   ? ?  Ordered  ?  amoxicillin-clavulanate (AUGMENTIN) 875-125 MG tablet  Every 12 hours       ? 11/22/21 0248  ? ?  ?  ? ?  ? ? ?  ?Marita Kansasli, Dechelle Attaway, PA-C ?11/22/21 [id number] ? ?  ?Dione BoozeGlick, David, MD ?11/22/21 [id number] ? ?

## 2021-11-22 NOTE — Telephone Encounter (Signed)
Patient did not get Augmentin sent to the pharmacy and called the emergency department today requesting that the Augmentin be resent to the pharmacy. ?

## 2021-11-22 NOTE — Discharge Instructions (Signed)
Your work-up today showed you have inflammation of your tonsils and pharynx.  No evidence of abscess.  You received first dose of antibiotic in the emergency room.  You received a dose of steroids prior to discharge.  I have sent additional antibiotics to the pharmacy for you.  If you have any worsening symptoms including inability to swallow, drooling, uncontrolled fever, please return to the emergency room. ?

## 2021-11-22 NOTE — ED Notes (Signed)
Pt tolerating oral intake.

## 2023-01-25 ENCOUNTER — Emergency Department (HOSPITAL_COMMUNITY): Payer: Medicaid Other

## 2023-01-25 ENCOUNTER — Encounter (HOSPITAL_COMMUNITY): Admission: EM | Disposition: A | Discharge status: Home / Self Care | Payer: Self-pay | Source: Home / Self Care

## 2023-01-25 ENCOUNTER — Other Ambulatory Visit: Payer: Self-pay

## 2023-01-25 ENCOUNTER — Inpatient Hospital Stay (HOSPITAL_COMMUNITY): Payer: Medicaid Other

## 2023-01-25 ENCOUNTER — Inpatient Hospital Stay (HOSPITAL_COMMUNITY)
Admission: EM | Admit: 2023-01-25 | Discharge: 2023-02-07 | DRG: 329 | Disposition: A | Discharge status: Home / Self Care | Payer: Medicaid Other | Attending: Surgery | Admitting: Surgery

## 2023-01-25 ENCOUNTER — Encounter (HOSPITAL_COMMUNITY): Payer: Self-pay | Admitting: Emergency Medicine

## 2023-01-25 ENCOUNTER — Inpatient Hospital Stay (HOSPITAL_COMMUNITY): Payer: Medicaid Other | Admitting: Certified Registered Nurse Anesthetist

## 2023-01-25 DIAGNOSIS — J9601 Acute respiratory failure with hypoxia: Secondary | ICD-10-CM | POA: Diagnosis not present

## 2023-01-25 DIAGNOSIS — S36531A Laceration of transverse colon, initial encounter: Secondary | ICD-10-CM | POA: Diagnosis present

## 2023-01-25 DIAGNOSIS — K567 Ileus, unspecified: Secondary | ICD-10-CM | POA: Diagnosis not present

## 2023-01-25 DIAGNOSIS — F1490 Cocaine use, unspecified, uncomplicated: Secondary | ICD-10-CM | POA: Diagnosis present

## 2023-01-25 DIAGNOSIS — S31149A Puncture wound of abdominal wall with foreign body, unspecified quadrant without penetration into peritoneal cavity, initial encounter: Secondary | ICD-10-CM | POA: Diagnosis not present

## 2023-01-25 DIAGNOSIS — E876 Hypokalemia: Secondary | ICD-10-CM | POA: Diagnosis present

## 2023-01-25 DIAGNOSIS — W3400XA Accidental discharge from unspecified firearms or gun, initial encounter: Secondary | ICD-10-CM | POA: Diagnosis not present

## 2023-01-25 DIAGNOSIS — Z9889 Other specified postprocedural states: Secondary | ICD-10-CM | POA: Diagnosis not present

## 2023-01-25 DIAGNOSIS — F430 Acute stress reaction: Secondary | ICD-10-CM | POA: Diagnosis not present

## 2023-01-25 DIAGNOSIS — Z23 Encounter for immunization: Secondary | ICD-10-CM | POA: Diagnosis not present

## 2023-01-25 DIAGNOSIS — Y249XXA Unspecified firearm discharge, undetermined intent, initial encounter: Secondary | ICD-10-CM

## 2023-01-25 DIAGNOSIS — F32A Depression, unspecified: Secondary | ICD-10-CM | POA: Diagnosis present

## 2023-01-25 DIAGNOSIS — S36591A Other injury of transverse colon, initial encounter: Secondary | ICD-10-CM | POA: Diagnosis not present

## 2023-01-25 DIAGNOSIS — F4310 Post-traumatic stress disorder, unspecified: Secondary | ICD-10-CM | POA: Diagnosis present

## 2023-01-25 DIAGNOSIS — F419 Anxiety disorder, unspecified: Secondary | ICD-10-CM | POA: Diagnosis present

## 2023-01-25 DIAGNOSIS — I10 Essential (primary) hypertension: Secondary | ICD-10-CM | POA: Diagnosis present

## 2023-01-25 DIAGNOSIS — R339 Retention of urine, unspecified: Secondary | ICD-10-CM | POA: Diagnosis not present

## 2023-01-25 DIAGNOSIS — S36498A Other injury of other part of small intestine, initial encounter: Secondary | ICD-10-CM | POA: Diagnosis present

## 2023-01-25 DIAGNOSIS — S31139A Puncture wound of abdominal wall without foreign body, unspecified quadrant without penetration into peritoneal cavity, initial encounter: Secondary | ICD-10-CM | POA: Diagnosis present

## 2023-01-25 DIAGNOSIS — Z6837 Body mass index (BMI) 37.0-37.9, adult: Secondary | ICD-10-CM | POA: Diagnosis not present

## 2023-01-25 HISTORY — PX: LAPAROTOMY: SHX154

## 2023-01-25 LAB — I-STAT CHEM 8, ED
BUN: 14 mg/dL (ref 6–20)
Calcium, Ion: 1.02 mmol/L — ABNORMAL LOW (ref 1.15–1.40)
Chloride: 106 mmol/L (ref 98–111)
Creatinine, Ser: 1 mg/dL (ref 0.61–1.24)
Glucose, Bld: 142 mg/dL — ABNORMAL HIGH (ref 70–99)
HCT: 51 % (ref 39.0–52.0)
Hemoglobin: 17.3 g/dL — ABNORMAL HIGH (ref 13.0–17.0)
Potassium: 3.6 mmol/L (ref 3.5–5.1)
Sodium: 141 mmol/L (ref 135–145)
TCO2: 24 mmol/L (ref 22–32)

## 2023-01-25 LAB — PROTIME-INR
INR: 1.1 (ref 0.8–1.2)
Prothrombin Time: 13.9 seconds (ref 11.4–15.2)

## 2023-01-25 LAB — CBC
HCT: 48.8 % (ref 39.0–52.0)
Hemoglobin: 16 g/dL (ref 13.0–17.0)
MCH: 28.6 pg (ref 26.0–34.0)
MCHC: 32.8 g/dL (ref 30.0–36.0)
MCV: 87.1 fL (ref 80.0–100.0)
Platelets: 343 10*3/uL (ref 150–400)
RBC: 5.6 MIL/uL (ref 4.22–5.81)
RDW: 14.6 % (ref 11.5–15.5)
WBC: 8.4 10*3/uL (ref 4.0–10.5)
nRBC: 0 % (ref 0.0–0.2)

## 2023-01-25 LAB — COMPREHENSIVE METABOLIC PANEL
ALT: 17 U/L (ref 0–44)
AST: 20 U/L (ref 15–41)
Albumin: 4 g/dL (ref 3.5–5.0)
Alkaline Phosphatase: 65 U/L (ref 38–126)
Anion gap: 17 — ABNORMAL HIGH (ref 5–15)
BUN: 12 mg/dL (ref 6–20)
CO2: 22 mmol/L (ref 22–32)
Calcium: 9.1 mg/dL (ref 8.9–10.3)
Chloride: 102 mmol/L (ref 98–111)
Creatinine, Ser: 1.08 mg/dL (ref 0.61–1.24)
GFR, Estimated: >60 mL/min (ref >60)
Glucose, Bld: 142 mg/dL — ABNORMAL HIGH (ref 70–99)
Potassium: 3.6 mmol/L (ref 3.5–5.1)
Sodium: 141 mmol/L (ref 135–145)
Total Bilirubin: 0.7 mg/dL (ref 0.3–1.2)
Total Protein: 7.6 g/dL (ref 6.5–8.1)

## 2023-01-25 LAB — ABO/RH: ABO/RH(D): O POS

## 2023-01-25 LAB — ETHANOL: Alcohol, Ethyl (B): <10 mg/dL (ref <10)

## 2023-01-25 LAB — TYPE AND SCREEN
ABO/RH(D): O POS
Antibody Screen: NEGATIVE

## 2023-01-25 LAB — LACTIC ACID, PLASMA: Lactic Acid, Venous: 2.9 mmol/L (ref 0.5–1.9)

## 2023-01-25 SURGERY — LAPAROTOMY, EXPLORATORY
Anesthesia: General

## 2023-01-25 MED ORDER — 0.9 % SODIUM CHLORIDE (POUR BTL) OPTIME
TOPICAL | Status: DC | PRN
  Administered 2023-01-25: 1000 mL

## 2023-01-25 MED ORDER — HYDROMORPHONE HCL 1 MG/ML IJ SOLN
INTRAMUSCULAR | Status: DC | PRN
  Administered 2023-01-25 (×2): 1 mg via INTRAVENOUS

## 2023-01-25 MED ORDER — ACETAMINOPHEN 500 MG PO TABS
1000.0000 mg | ORAL_TABLET | Freq: Four times a day (QID) | ORAL | Status: DC
  Administered 2023-01-26 – 2023-02-06 (×45): 1000 mg
  Filled 2023-01-25 (×48): qty 2

## 2023-01-25 MED ORDER — MIDAZOLAM HCL 2 MG/2ML IJ SOLN
INTRAMUSCULAR | Status: DC | PRN
  Administered 2023-01-25 (×2): 2 mg via INTRAVENOUS

## 2023-01-25 MED ORDER — PROPOFOL 10 MG/ML IV BOLUS
INTRAVENOUS | Status: DC | PRN
  Administered 2023-01-25: 50 mg via INTRAVENOUS
  Administered 2023-01-25: 150 mg via INTRAVENOUS

## 2023-01-25 MED ORDER — PROPOFOL 1000 MG/100ML IV EMUL
INTRAVENOUS | Status: AC
  Filled 2023-01-25: qty 100

## 2023-01-25 MED ORDER — ROCURONIUM BROMIDE 10 MG/ML (PF) SYRINGE
PREFILLED_SYRINGE | INTRAVENOUS | Status: DC | PRN
  Administered 2023-01-25 (×2): 50 mg via INTRAVENOUS

## 2023-01-25 MED ORDER — METHOCARBAMOL 1000 MG/10ML IJ SOLN
500.0000 mg | Freq: Three times a day (TID) | INTRAVENOUS | Status: DC
  Filled 2023-01-25 (×4): qty 5

## 2023-01-25 MED ORDER — FENTANYL CITRATE (PF) 250 MCG/5ML IJ SOLN
INTRAMUSCULAR | Status: DC | PRN
  Administered 2023-01-25: 150 ug via INTRAVENOUS
  Administered 2023-01-25: 100 ug via INTRAVENOUS

## 2023-01-25 MED ORDER — MIDAZOLAM HCL 2 MG/2ML IJ SOLN
1.0000 mg | INTRAMUSCULAR | Status: DC | PRN
  Administered 2023-01-26 – 2023-01-29 (×7): 2 mg via INTRAVENOUS
  Filled 2023-01-25 (×6): qty 2

## 2023-01-25 MED ORDER — SUCCINYLCHOLINE CHLORIDE 200 MG/10ML IV SOSY
PREFILLED_SYRINGE | INTRAVENOUS | Status: DC | PRN
  Administered 2023-01-25: 140 mg via INTRAVENOUS

## 2023-01-25 MED ORDER — EPHEDRINE SULFATE-NACL 50-0.9 MG/10ML-% IV SOSY
PREFILLED_SYRINGE | INTRAVENOUS | Status: DC | PRN
  Administered 2023-01-25: 10 mg via INTRAVENOUS

## 2023-01-25 MED ORDER — FENTANYL 2500MCG IN NS 250ML (10MCG/ML) PREMIX INFUSION
0.0000 ug/h | INTRAVENOUS | Status: DC
  Administered 2023-01-26: 50 ug/h via INTRAVENOUS
  Administered 2023-01-26: 200 ug/h via INTRAVENOUS
  Administered 2023-01-26: 250 ug/h via INTRAVENOUS
  Administered 2023-01-27: 350 ug/h via INTRAVENOUS
  Administered 2023-01-27 – 2023-01-28 (×2): 300 ug/h via INTRAVENOUS
  Administered 2023-01-28 (×3): 250 ug/h via INTRAVENOUS
  Administered 2023-01-29 (×2): 200 ug/h via INTRAVENOUS
  Administered 2023-01-30: 150 ug/h via INTRAVENOUS
  Administered 2023-01-30: 175 ug/h via INTRAVENOUS
  Administered 2023-01-31: 200 ug/h via INTRAVENOUS
  Filled 2023-01-25 (×16): qty 250

## 2023-01-25 MED ORDER — PROPOFOL 1000 MG/100ML IV EMUL
0.0000 ug/kg/min | INTRAVENOUS | Status: DC
  Administered 2023-01-26: 35 ug/kg/min via INTRAVENOUS
  Administered 2023-01-26 (×5): 30 ug/kg/min via INTRAVENOUS
  Administered 2023-01-26: 40 ug/kg/min via INTRAVENOUS
  Administered 2023-01-26: 50 ug/kg/min via INTRAVENOUS
  Administered 2023-01-27 (×4): 40 ug/kg/min via INTRAVENOUS
  Administered 2023-01-27: 30 ug/kg/min via INTRAVENOUS
  Administered 2023-01-27 – 2023-01-28 (×2): 40 ug/kg/min via INTRAVENOUS
  Administered 2023-01-28: 15 ug/kg/min via INTRAVENOUS
  Administered 2023-01-28: 40 ug/kg/min via INTRAVENOUS
  Administered 2023-01-28: 30 ug/kg/min via INTRAVENOUS
  Administered 2023-01-28: 40 ug/kg/min via INTRAVENOUS
  Administered 2023-01-28 (×2): 30 ug/kg/min via INTRAVENOUS
  Administered 2023-01-29: 25 ug/kg/min via INTRAVENOUS
  Administered 2023-01-29: 20 ug/kg/min via INTRAVENOUS
  Administered 2023-01-29: 5 ug/kg/min via INTRAVENOUS
  Administered 2023-01-29: 20 ug/kg/min via INTRAVENOUS
  Administered 2023-01-30: 10 ug/kg/min via INTRAVENOUS
  Administered 2023-01-30: 2 ug/kg/min via INTRAVENOUS
  Filled 2023-01-25 (×26): qty 100

## 2023-01-25 MED ORDER — SODIUM CHLORIDE 0.9 % IV SOLN: INTRAVENOUS | Status: DC

## 2023-01-25 MED ORDER — ORAL CARE MOUTH RINSE: 15.0000 mL | OROMUCOSAL | Status: DC | PRN

## 2023-01-25 MED ORDER — FENTANYL CITRATE PF 50 MCG/ML IJ SOSY
100.0000 ug | PREFILLED_SYRINGE | Freq: Once | INTRAMUSCULAR | Status: AC
  Administered 2023-01-25: 100 ug via INTRAVENOUS

## 2023-01-25 MED ORDER — HYDRALAZINE HCL 20 MG/ML IJ SOLN
10.0000 mg | INTRAMUSCULAR | Status: DC | PRN
  Administered 2023-01-26 – 2023-01-31 (×2): 10 mg via INTRAVENOUS
  Filled 2023-01-25 (×5): qty 1

## 2023-01-25 MED ORDER — METHOCARBAMOL 500 MG PO TABS
500.0000 mg | ORAL_TABLET | Freq: Three times a day (TID) | ORAL | Status: DC
  Administered 2023-01-26 (×2): 500 mg via ORAL
  Filled 2023-01-25 (×2): qty 1

## 2023-01-25 MED ORDER — LIDOCAINE 2% (20 MG/ML) 5 ML SYRINGE
INTRAMUSCULAR | Status: DC | PRN
  Administered 2023-01-25: 60 mg via INTRAVENOUS

## 2023-01-25 MED ORDER — FENTANYL CITRATE (PF) 250 MCG/5ML IJ SOLN
INTRAMUSCULAR | Status: AC
  Filled 2023-01-25: qty 5

## 2023-01-25 MED ORDER — MIDAZOLAM HCL 2 MG/2ML IJ SOLN
INTRAMUSCULAR | Status: AC
  Filled 2023-01-25: qty 2

## 2023-01-25 MED ORDER — ORAL CARE MOUTH RINSE
15.0000 mL | OROMUCOSAL | Status: DC
  Administered 2023-01-26 – 2023-01-31 (×66): 15 mL via OROMUCOSAL

## 2023-01-25 MED ORDER — CHLORHEXIDINE GLUCONATE CLOTH 2 % EX PADS
6.0000 | MEDICATED_PAD | Freq: Every day | CUTANEOUS | Status: DC
  Administered 2023-01-26 – 2023-02-05 (×13): 6 via TOPICAL

## 2023-01-25 MED ORDER — HYDRALAZINE HCL 20 MG/ML IJ SOLN
10.0000 mg | INTRAMUSCULAR | Status: DC | PRN
  Administered 2023-01-26 – 2023-01-30 (×2): 10 mg via INTRAVENOUS
  Filled 2023-01-25: qty 1

## 2023-01-25 MED ORDER — ONDANSETRON HCL 4 MG/2ML IJ SOLN
4.0000 mg | Freq: Four times a day (QID) | INTRAMUSCULAR | Status: DC | PRN
  Administered 2023-01-30 – 2023-02-04 (×4): 4 mg via INTRAVENOUS
  Filled 2023-01-25 (×4): qty 2

## 2023-01-25 MED ORDER — PROPOFOL 10 MG/ML IV BOLUS
INTRAVENOUS | Status: AC
  Filled 2023-01-25: qty 20

## 2023-01-25 MED ORDER — FENTANYL CITRATE PF 50 MCG/ML IJ SOSY: 50.0000 ug | PREFILLED_SYRINGE | Freq: Once | INTRAMUSCULAR | Status: DC

## 2023-01-25 MED ORDER — METOPROLOL TARTRATE 5 MG/5ML IV SOLN: 5.0000 mg | Freq: Four times a day (QID) | INTRAVENOUS | Status: DC | PRN

## 2023-01-25 MED ORDER — ALBUMIN HUMAN 5 % IV SOLN: INTRAVENOUS | Status: DC | PRN

## 2023-01-25 MED ORDER — HYDROMORPHONE HCL 1 MG/ML IJ SOLN
INTRAMUSCULAR | Status: AC
  Filled 2023-01-25: qty 0.5

## 2023-01-25 MED ORDER — SUCCINYLCHOLINE CHLORIDE 200 MG/10ML IV SOSY
PREFILLED_SYRINGE | INTRAVENOUS | Status: AC
  Filled 2023-01-25: qty 10

## 2023-01-25 MED ORDER — ROCURONIUM BROMIDE 10 MG/ML (PF) SYRINGE
PREFILLED_SYRINGE | INTRAVENOUS | Status: AC
  Filled 2023-01-25: qty 10

## 2023-01-25 MED ORDER — MIDAZOLAM HCL 2 MG/2ML IJ SOLN: INTRAMUSCULAR | Status: DC | PRN

## 2023-01-25 MED ORDER — TETANUS-DIPHTH-ACELL PERTUSSIS 5-2.5-18.5 LF-MCG/0.5 IM SUSY
0.5000 mL | PREFILLED_SYRINGE | Freq: Once | INTRAMUSCULAR | Status: AC
  Administered 2023-01-25: 0.5 mL via INTRAMUSCULAR

## 2023-01-25 MED ORDER — FENTANYL CITRATE PF 50 MCG/ML IJ SOSY
PREFILLED_SYRINGE | INTRAMUSCULAR | Status: AC
  Filled 2023-01-25: qty 2

## 2023-01-25 MED ORDER — EPHEDRINE 5 MG/ML INJ
INTRAVENOUS | Status: AC
  Filled 2023-01-25: qty 5

## 2023-01-25 MED ORDER — LACTATED RINGERS IV SOLN: INTRAVENOUS | Status: DC | PRN

## 2023-01-25 MED ORDER — FENTANYL BOLUS VIA INFUSION
50.0000 ug | INTRAVENOUS | Status: DC | PRN
  Administered 2023-01-26 – 2023-01-31 (×5): 100 ug via INTRAVENOUS

## 2023-01-25 MED ORDER — ONDANSETRON 4 MG PO TBDP
4.0000 mg | ORAL_TABLET | Freq: Four times a day (QID) | ORAL | Status: DC | PRN
  Filled 2023-01-25: qty 1

## 2023-01-25 MED ORDER — PIPERACILLIN-TAZOBACTAM 3.375 G IVPB 30 MIN
3.3750 g | Freq: Once | INTRAVENOUS | Status: AC
  Administered 2023-01-25: 3.375 g via INTRAVENOUS

## 2023-01-25 MED ORDER — PHENYLEPHRINE 80 MCG/ML (10ML) SYRINGE FOR IV PUSH (FOR BLOOD PRESSURE SUPPORT)
PREFILLED_SYRINGE | INTRAVENOUS | Status: AC
  Filled 2023-01-25: qty 10

## 2023-01-25 MED ORDER — PROPOFOL 500 MG/50ML IV EMUL
INTRAVENOUS | Status: DC | PRN
  Administered 2023-01-25: 100 ug/kg/min via INTRAVENOUS

## 2023-01-25 SURGICAL SUPPLY — 46 items
APL PRP STRL LF DISP 70% ISPRP (MISCELLANEOUS) ×1
BLADE CLIPPER SURG (BLADE) IMPLANT
CANISTER SUCT 3000ML PPV (MISCELLANEOUS) ×1 IMPLANT
CHLORAPREP W/TINT 26 (MISCELLANEOUS) ×1 IMPLANT
COVER SURGICAL LIGHT HANDLE (MISCELLANEOUS) ×1 IMPLANT
DRAPE LAPAROSCOPIC ABDOMINAL (DRAPES) ×1 IMPLANT
DRAPE WARM FLUID 44X44 (DRAPES) ×1 IMPLANT
DRSG OPSITE POSTOP 4X10 (GAUZE/BANDAGES/DRESSINGS) IMPLANT
DRSG OPSITE POSTOP 4X8 (GAUZE/BANDAGES/DRESSINGS) IMPLANT
DRSG VAC GRANUFOAM LG (GAUZE/BANDAGES/DRESSINGS) IMPLANT
ELECT BLADE 6.5 EXT (BLADE) IMPLANT
ELECT CAUTERY BLADE 6.4 (BLADE) ×1 IMPLANT
ELECT REM PT RETURN 9FT ADLT (ELECTROSURGICAL) ×1
ELECTRODE REM PT RTRN 9FT ADLT (ELECTROSURGICAL) ×1 IMPLANT
GLOVE BIO SURGEON STRL SZ7.5 (GLOVE) ×1 IMPLANT
GLOVE BIOGEL PI IND STRL 8 (GLOVE) ×1 IMPLANT
GOWN STRL REUS W/ TWL LRG LVL3 (GOWN DISPOSABLE) ×1 IMPLANT
GOWN STRL REUS W/ TWL XL LVL3 (GOWN DISPOSABLE) ×1 IMPLANT
GOWN STRL REUS W/TWL LRG LVL3 (GOWN DISPOSABLE) ×1
GOWN STRL REUS W/TWL XL LVL3 (GOWN DISPOSABLE) ×1
HANDLE SUCTION POOLE (INSTRUMENTS) ×1 IMPLANT
KIT BASIN OR (CUSTOM PROCEDURE TRAY) ×1 IMPLANT
KIT TURNOVER KIT B (KITS) ×1 IMPLANT
LIGASURE IMPACT 36 18CM CVD LR (INSTRUMENTS) IMPLANT
NS IRRIG 1000ML POUR BTL (IV SOLUTION) ×2 IMPLANT
PACK GENERAL/GYN (CUSTOM PROCEDURE TRAY) ×1 IMPLANT
PAD ARMBOARD 7.5X6 YLW CONV (MISCELLANEOUS) ×1 IMPLANT
PENCIL SMOKE EVACUATOR (MISCELLANEOUS) ×1 IMPLANT
SPECIMEN JAR LARGE (MISCELLANEOUS) IMPLANT
SPONGE ABD ABTHERA ADVANCE (MISCELLANEOUS) IMPLANT
SPONGE T-LAP 18X18 ~~LOC~~+RFID (SPONGE) IMPLANT
STAPLER VISISTAT 35W (STAPLE) ×1 IMPLANT
SUCTION POOLE HANDLE (INSTRUMENTS) ×1
SUT PDS AB 0 CTX 36 PDP370T (SUTURE) IMPLANT
SUT PDS AB 1 TP1 54 (SUTURE) ×2 IMPLANT
SUT SILK 2 0 SH CR/8 (SUTURE) ×1 IMPLANT
SUT SILK 2 0 TIES 10X30 (SUTURE) ×1 IMPLANT
SUT SILK 3 0 SH CR/8 (SUTURE) ×1 IMPLANT
SUT SILK 3 0 TIES 10X30 (SUTURE) ×1 IMPLANT
SUT VIC AB 0 CT1 27 (SUTURE) ×2
SUT VIC AB 0 CT1 27XBRD ANBCTR (SUTURE) IMPLANT
SUT VIC AB 2-0 CP2 18 (SUTURE) IMPLANT
SUT VIC AB 2-0 SH 18 (SUTURE) IMPLANT
TOWEL GREEN STERILE (TOWEL DISPOSABLE) ×1 IMPLANT
TRAY FOLEY MTR SLVR 16FR STAT (SET/KITS/TRAYS/PACK) ×1 IMPLANT
YANKAUER SUCT BULB TIP NO VENT (SUCTIONS) IMPLANT
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
## 2023-01-25 NOTE — Anesthesia Procedure Notes (Signed)
Procedure Name: Intubation Date/Time: 01/25/2023 10:03 PM  Performed by: Sheppard Evens, CRNAPre-anesthesia Checklist: Patient identified, Emergency Drugs available, Suction available and Patient being monitored Patient Re-evaluated:Patient Re-evaluated prior to induction Oxygen Delivery Method: Circle System Utilized Preoxygenation: Pre-oxygenation with 100% oxygen Induction Type: IV induction, Rapid sequence and Cricoid Pressure applied Laryngoscope Size: Mac and 3 Grade View: Grade I Tube type: Oral Tube size: 7.5 mm Number of attempts: 1 Airway Equipment and Method: Stylet and Oral airway Placement Confirmation: ETT inserted through vocal cords under direct vision, positive ETCO2 and breath sounds checked- equal and bilateral Secured at: 22 cm Tube secured with: Tape Dental Injury: Teeth and Oropharynx as per pre-operative assessment
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
## 2023-01-25 NOTE — Anesthesia Procedure Notes (Addendum)
Arterial Line Insertion Start/End7/12/2022 10:10 PM, 01/25/2023 10:15 PM Performed by: Leonides Grills, MD, anesthesiologist  Patient location: OR. Preanesthetic checklist: patient identified, IV checked, site marked, risks and benefits discussed, surgical consent, monitors and equipment checked, pre-op evaluation, timeout performed and anesthesia consent Left, radial was placed Catheter size: 20 G Hand hygiene performed , maximum sterile barriers used  and Seldinger technique used  Attempts: 1 Procedure performed using ultrasound guided technique. Ultrasound Notes:anatomy identified, needle tip was noted to be adjacent to the nerve/plexus identified and no ultrasound evidence of intravascular and/or intraneural injection Following insertion, dressing applied and Biopatch. Post procedure assessment: normal and unchanged  Patient tolerated the procedure well with no immediate complications.
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
## 2023-01-25 NOTE — Transfer of Care (Signed)
Immediate Anesthesia Transfer of Care Note  Patient: Derrick Hull  Procedure(s) Performed: EXPLORATORY LAPAROTOMY WITH PARTIAL OMENTECTOMY  Patient Location: ICU  Anesthesia Type:General  Level of Consciousness: sedated and Patient remains intubated per anesthesia plan  Airway & Oxygen Therapy: Patient remains intubated per anesthesia plan and Patient placed on Ventilator (see vital sign flow sheet for setting)  Post-op Assessment: Report given to RN and Post -op Vital signs reviewed and stable  Post vital signs: Reviewed and unstable  Last Vitals:  Vitals Value Taken Time  BP 135/89 01/25/23 2345  Temp    Pulse 84 01/25/23 2355  Resp 16 01/25/23 2355  SpO2 100 % 01/25/23 2355  Vitals shown include unvalidated device data.  Last Pain:  Vitals:   01/25/23 2137  TempSrc:   PainSc: 10-Worst pain ever         Complications: No notable events documented. Patient transported to ICU with standard monitors (HR, BP, SPO2, RR) and emergency drugs/equipment. Controlled ventilation maintained via ambu bag. Report given to bedside RN and respiratory therapist. Pt connected to ICU monitor and ventilator. All questions answered and vital signs stable before leaving
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
## 2023-01-26 ENCOUNTER — Inpatient Hospital Stay (HOSPITAL_COMMUNITY): Payer: Medicaid Other

## 2023-01-26 ENCOUNTER — Other Ambulatory Visit: Payer: Self-pay

## 2023-01-26 LAB — HIV ANTIBODY (ROUTINE TESTING W REFLEX): HIV Screen 4th Generation wRfx: NONREACTIVE

## 2023-01-26 LAB — POCT I-STAT 7, (LYTES, BLD GAS, ICA,H+H)
Acid-Base Excess: 1 mmol/L (ref 0.0–2.0)
Acid-base deficit: 1 mmol/L (ref 0.0–2.0)
Bicarbonate: 26.2 mmol/L (ref 20.0–28.0)
Bicarbonate: 26.3 mmol/L (ref 20.0–28.0)
Calcium, Ion: 1.11 mmol/L — ABNORMAL LOW (ref 1.15–1.40)
Calcium, Ion: 1.16 mmol/L (ref 1.15–1.40)
HCT: 42 % (ref 39.0–52.0)
HCT: 43 % (ref 39.0–52.0)
Hemoglobin: 14.3 g/dL (ref 13.0–17.0)
Hemoglobin: 14.6 g/dL (ref 13.0–17.0)
O2 Saturation: 100 %
O2 Saturation: 100 %
Patient temperature: 97.7
Potassium: 3.2 mmol/L — ABNORMAL LOW (ref 3.5–5.1)
Potassium: 3.5 mmol/L (ref 3.5–5.1)
Sodium: 141 mmol/L (ref 135–145)
Sodium: 142 mmol/L (ref 135–145)
TCO2: 27 mmol/L (ref 22–32)
TCO2: 28 mmol/L (ref 22–32)
pCO2 arterial: 42.5 mmHg (ref 32–48)
pCO2 arterial: 51.6 mmHg — ABNORMAL HIGH (ref 32–48)
pH, Arterial: 7.315 — ABNORMAL LOW (ref 7.35–7.45)
pH, Arterial: 7.396 (ref 7.35–7.45)
pO2, Arterial: 260 mmHg — ABNORMAL HIGH (ref 83–108)
pO2, Arterial: 388 mmHg — ABNORMAL HIGH (ref 83–108)

## 2023-01-26 LAB — CBC
HCT: 39.6 % (ref 39.0–52.0)
Hemoglobin: 12.9 g/dL — ABNORMAL LOW (ref 13.0–17.0)
MCH: 28.5 pg (ref 26.0–34.0)
MCHC: 32.6 g/dL (ref 30.0–36.0)
MCV: 87.4 fL (ref 80.0–100.0)
Platelets: 265 10*3/uL (ref 150–400)
RBC: 4.53 MIL/uL (ref 4.22–5.81)
RDW: 14.8 % (ref 11.5–15.5)
WBC: 12.8 10*3/uL — ABNORMAL HIGH (ref 4.0–10.5)
nRBC: 0 % (ref 0.0–0.2)

## 2023-01-26 LAB — URINALYSIS, ROUTINE W REFLEX MICROSCOPIC
Bacteria, UA: NONE SEEN
Bilirubin Urine: NEGATIVE
Glucose, UA: NEGATIVE mg/dL
Hgb urine dipstick: NEGATIVE
Ketones, ur: NEGATIVE mg/dL
Leukocytes,Ua: NEGATIVE
Nitrite: NEGATIVE
Protein, ur: 30 mg/dL — AB
Specific Gravity, Urine: >1.046 — ABNORMAL HIGH (ref 1.005–1.030)
pH: 5 (ref 5.0–8.0)

## 2023-01-26 LAB — BASIC METABOLIC PANEL
Anion gap: 8 (ref 5–15)
BUN: 10 mg/dL (ref 6–20)
CO2: 20 mmol/L — ABNORMAL LOW (ref 22–32)
Calcium: 7.4 mg/dL — ABNORMAL LOW (ref 8.9–10.3)
Chloride: 112 mmol/L — ABNORMAL HIGH (ref 98–111)
Creatinine, Ser: 0.82 mg/dL (ref 0.61–1.24)
GFR, Estimated: >60 mL/min (ref >60)
Glucose, Bld: 101 mg/dL — ABNORMAL HIGH (ref 70–99)
Potassium: 3 mmol/L — ABNORMAL LOW (ref 3.5–5.1)
Sodium: 140 mmol/L (ref 135–145)

## 2023-01-26 LAB — MRSA NEXT GEN BY PCR, NASAL: MRSA by PCR Next Gen: DETECTED — AB

## 2023-01-26 LAB — TRIGLYCERIDES: Triglycerides: 80 mg/dL (ref <150)

## 2023-01-26 MED ORDER — METHOCARBAMOL 1000 MG/10ML IJ SOLN
500.0000 mg | Freq: Three times a day (TID) | INTRAVENOUS | Status: AC
  Administered 2023-01-26 – 2023-01-28 (×3): 500 mg via INTRAVENOUS
  Filled 2023-01-26: qty 500
  Filled 2023-01-26: qty 5
  Filled 2023-01-26: qty 500

## 2023-01-26 MED ORDER — ENOXAPARIN SODIUM 30 MG/0.3ML IJ SOSY
30.0000 mg | PREFILLED_SYRINGE | Freq: Two times a day (BID) | INTRAMUSCULAR | Status: DC
  Administered 2023-01-26 – 2023-01-29 (×6): 30 mg via SUBCUTANEOUS
  Filled 2023-01-26 (×7): qty 0.3

## 2023-01-26 MED ORDER — LABETALOL HCL 5 MG/ML IV SOLN
10.0000 mg | INTRAVENOUS | Status: DC | PRN
  Administered 2023-01-26 (×2): 10 mg via INTRAVENOUS
  Filled 2023-01-26 (×3): qty 4

## 2023-01-26 MED ORDER — CEFAZOLIN IN SODIUM CHLORIDE 3-0.9 GM/100ML-% IV SOLN
3.0000 g | INTRAVENOUS | Status: DC
  Filled 2023-01-26: qty 100

## 2023-01-26 MED ORDER — METHOCARBAMOL 500 MG PO TABS
500.0000 mg | ORAL_TABLET | Freq: Three times a day (TID) | ORAL | Status: AC
  Administered 2023-01-27 – 2023-01-28 (×3): 500 mg
  Filled 2023-01-26 (×4): qty 1

## 2023-01-26 MED ORDER — MIDAZOLAM HCL 2 MG/2ML IJ SOLN
INTRAMUSCULAR | Status: AC
  Filled 2023-01-26: qty 2

## 2023-01-26 MED ORDER — MUPIROCIN 2 % EX OINT
1.0000 | TOPICAL_OINTMENT | Freq: Two times a day (BID) | CUTANEOUS | Status: AC
  Administered 2023-01-27 – 2023-01-31 (×9): 1 via NASAL
  Filled 2023-01-26: qty 22

## 2023-01-26 MED ORDER — POTASSIUM CHLORIDE 10 MEQ/100ML IV SOLN
10.0000 meq | INTRAVENOUS | Status: AC
  Administered 2023-01-26 (×4): 10 meq via INTRAVENOUS
  Filled 2023-01-26 (×4): qty 100

## 2023-01-26 MED ORDER — MIDAZOLAM-SODIUM CHLORIDE 100-0.9 MG/100ML-% IV SOLN
0.5000 mg/h | INTRAVENOUS | Status: DC
  Administered 2023-01-26: 0.5 mg/h via INTRAVENOUS
  Administered 2023-01-27: 1 mg/h via INTRAVENOUS
  Administered 2023-01-28: 0.5 mg/h via INTRAVENOUS
  Filled 2023-01-26 (×2): qty 100

## 2023-01-26 MED ORDER — CEFAZOLIN IN SODIUM CHLORIDE 3-0.9 GM/100ML-% IV SOLN
3.0000 g | INTRAVENOUS | Status: AC
  Administered 2023-01-27: 3 g via INTRAVENOUS
  Filled 2023-01-26: qty 100

## 2023-01-26 MED ORDER — IOHEXOL 350 MG/ML SOLN
75.0000 mL | Freq: Once | INTRAVENOUS | Status: AC | PRN
  Administered 2023-01-26: 75 mL via INTRAVENOUS

## 2023-01-26 MED ORDER — POTASSIUM CHLORIDE 20 MEQ PO PACK
40.0000 meq | PACK | Freq: Once | ORAL | Status: AC
  Administered 2023-01-26: 40 meq
  Filled 2023-01-26: qty 2

## 2023-01-26 NOTE — Progress Notes (Signed)
Contact Sgt. Lonia Chimera with [facility] for any updates.  [phone]
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
## 2023-01-26 NOTE — Progress Notes (Signed)
Patient transported to CT and back without any complications.  
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
## 2023-01-26 NOTE — Progress Notes (Signed)
Patient foley output 50 ml from 1900-2200. Trauma notified. Trauma said to watch urine but no intervention at this time. No new orders given.

## 2023-01-26 NOTE — Progress Notes (Signed)
   01/26/23 0053  Spiritual Encounters  Type of Visit Follow up  Care provided to: [facility] partners present during encounter Nurse  Referral source Trauma page  Reason for visit Routine spiritual support  OnCall Visit Yes  Spiritual Framework  Presenting Themes Community and relationships;Other (comment)  Interventions  Spiritual Care Interventions Made Established relationship of care and support;Compassionate presence;Reflective listening  Intervention Outcomes  Outcomes Connection to spiritual care;Reduced isolation   Received page to respond to family of patient in waiting are of ED. Surgery complete and patient no longer in recovery room. Patient has been transported to ICU in 4N. Girl friend Yevonne Aline arrived inquiring about patient. GF brought overnight bag. Received instructions to escort GF to patient's room in [phone]
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
## 2023-01-26 NOTE — Progress Notes (Signed)
RT NOTE: holding SBT on patient this AM.  Patient tolerating current ventilator settings well at this time.  Will continue to monitor.

## 2023-01-26 NOTE — Anesthesia Postprocedure Evaluation (Signed)
Anesthesia Post Note  Patient: Derrick Hull  Procedure(s) Performed: EXPLORATORY LAPAROTOMY WITH PARTIAL OMENTECTOMY     Patient location during evaluation: ICU Anesthesia Type: General Level of consciousness: sedated Pain management: pain level controlled Vital Signs Assessment: post-procedure vital signs reviewed and stable Respiratory status: patient remains intubated per anesthesia plan Cardiovascular status: stable Postop Assessment: no apparent nausea or vomiting Anesthetic complications: no   No notable events documented.  Last Vitals:  Vitals:   01/26/23 0259 01/26/23 0400  BP:    Pulse: 76   Resp:    Temp:  36.6 C  SpO2:      Last Pain:  Vitals:   01/26/23 0400  TempSrc: Axillary  PainSc:                  Catheryn Bacon Durenda Pechacek
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
## 2023-01-27 ENCOUNTER — Encounter (HOSPITAL_COMMUNITY): Payer: Self-pay | Admitting: Surgery

## 2023-01-27 ENCOUNTER — Other Ambulatory Visit: Payer: Self-pay

## 2023-01-27 ENCOUNTER — Inpatient Hospital Stay (HOSPITAL_COMMUNITY): Payer: Medicaid Other | Admitting: Certified Registered"

## 2023-01-27 ENCOUNTER — Encounter (HOSPITAL_COMMUNITY): Admission: EM | Disposition: A | Discharge status: Home / Self Care | Payer: Self-pay | Source: Home / Self Care

## 2023-01-27 DIAGNOSIS — S31149A Puncture wound of abdominal wall with foreign body, unspecified quadrant without penetration into peritoneal cavity, initial encounter: Secondary | ICD-10-CM

## 2023-01-27 DIAGNOSIS — Z6837 Body mass index (BMI) 37.0-37.9, adult: Secondary | ICD-10-CM

## 2023-01-27 HISTORY — PX: INCISION AND DRAINAGE OF WOUND: SHX1803

## 2023-01-27 LAB — BASIC METABOLIC PANEL
Anion gap: 7 (ref 5–15)
BUN: 7 mg/dL (ref 6–20)
CO2: 22 mmol/L (ref 22–32)
Calcium: 7.8 mg/dL — ABNORMAL LOW (ref 8.9–10.3)
Chloride: 108 mmol/L (ref 98–111)
Creatinine, Ser: 0.94 mg/dL (ref 0.61–1.24)
GFR, Estimated: >60 mL/min (ref >60)
Glucose, Bld: 94 mg/dL (ref 70–99)
Potassium: 3.6 mmol/L (ref 3.5–5.1)
Sodium: 137 mmol/L (ref 135–145)

## 2023-01-27 LAB — MAGNESIUM: Magnesium: 1.6 mg/dL — ABNORMAL LOW (ref 1.7–2.4)

## 2023-01-27 SURGERY — IRRIGATION AND DEBRIDEMENT WOUND
Anesthesia: General

## 2023-01-27 MED ORDER — DEXAMETHASONE SODIUM PHOSPHATE 10 MG/ML IJ SOLN
INTRAMUSCULAR | Status: AC
  Filled 2023-01-27: qty 2

## 2023-01-27 MED ORDER — CEFAZOLIN SODIUM 1 G IJ SOLR
INTRAMUSCULAR | Status: AC
  Filled 2023-01-27: qty 30

## 2023-01-27 MED ORDER — ROCURONIUM BROMIDE 10 MG/ML (PF) SYRINGE
PREFILLED_SYRINGE | INTRAVENOUS | Status: DC | PRN
  Administered 2023-01-27: 70 mg via INTRAVENOUS

## 2023-01-27 MED ORDER — ONDANSETRON HCL 4 MG/2ML IJ SOLN
INTRAMUSCULAR | Status: AC
  Filled 2023-01-27: qty 4

## 2023-01-27 MED ORDER — DEXAMETHASONE SODIUM PHOSPHATE 10 MG/ML IJ SOLN
INTRAMUSCULAR | Status: DC | PRN
  Administered 2023-01-27: 5 mg via INTRAVENOUS

## 2023-01-27 MED ORDER — PANTOPRAZOLE SODIUM 40 MG IV SOLR
40.0000 mg | Freq: Two times a day (BID) | INTRAVENOUS | Status: DC
  Administered 2023-01-27 – 2023-02-03 (×15): 40 mg via INTRAVENOUS
  Filled 2023-01-27 (×15): qty 10

## 2023-01-27 MED ORDER — SODIUM CHLORIDE 0.9 % IV BOLUS
1000.0000 mL | Freq: Once | INTRAVENOUS | Status: AC
  Administered 2023-01-27: 1000 mL via INTRAVENOUS

## 2023-01-27 MED ORDER — MIDAZOLAM HCL 2 MG/2ML IJ SOLN
INTRAMUSCULAR | Status: AC
  Filled 2023-01-27: qty 2

## 2023-01-27 MED ORDER — LACTATED RINGERS IV SOLN: INTRAVENOUS | Status: DC | PRN

## 2023-01-27 SURGICAL SUPPLY — 34 items
BAG COUNTER SPONGE SURGICOUNT (BAG) ×1 IMPLANT
BAG SPNG CNTER NS LX DISP (BAG) ×1
BNDG GAUZE DERMACEA FLUFF 4 (GAUZE/BANDAGES/DRESSINGS) IMPLANT
BNDG GZE DERMACEA 4 6PLY (GAUZE/BANDAGES/DRESSINGS)
CANISTER SUCT 3000ML PPV (MISCELLANEOUS) ×1 IMPLANT
COVER SURGICAL LIGHT HANDLE (MISCELLANEOUS) ×1 IMPLANT
DRAPE LAPAROSCOPIC ABDOMINAL (DRAPES) IMPLANT
DRAPE LAPAROTOMY 100X72 PEDS (DRAPES) IMPLANT
ELECT REM PT RETURN 9FT ADLT (ELECTROSURGICAL) ×1
ELECTRODE REM PT RTRN 9FT ADLT (ELECTROSURGICAL) ×1 IMPLANT
GAUZE PAD ABD 7.5X8 STRL (GAUZE/BANDAGES/DRESSINGS) IMPLANT
GAUZE PAD ABD 8X10 STRL (GAUZE/BANDAGES/DRESSINGS) IMPLANT
GAUZE SPONGE 4X4 12PLY STRL (GAUZE/BANDAGES/DRESSINGS) IMPLANT
GLOVE BIO SURGEON STRL SZ 6.5 (GLOVE) ×1 IMPLANT
GLOVE BIOGEL PI IND STRL 6 (GLOVE) ×1 IMPLANT
GOWN STRL REUS W/ TWL LRG LVL3 (GOWN DISPOSABLE) ×2 IMPLANT
GOWN STRL REUS W/TWL LRG LVL3 (GOWN DISPOSABLE) ×2
HANDLE SUCTION POOLE (INSTRUMENTS) IMPLANT
KIT BASIN OR (CUSTOM PROCEDURE TRAY) ×1 IMPLANT
KIT TURNOVER KIT B (KITS) ×1 IMPLANT
NS IRRIG 1000ML POUR BTL (IV SOLUTION) ×1 IMPLANT
PACK GENERAL/GYN (CUSTOM PROCEDURE TRAY) ×1 IMPLANT
PAD ARMBOARD 7.5X6 YLW CONV (MISCELLANEOUS) ×1 IMPLANT
PENCIL SMOKE EVACUATOR (MISCELLANEOUS) ×1 IMPLANT
SUCTION POOLE HANDLE (INSTRUMENTS) ×1
SUT BOLSTER RETENT 3/16X1 3/4 (MISCELLANEOUS) ×1
SUT ETHILON 2 LR (SUTURE) IMPLANT
SUT PDS AB 1 TP1 54 (SUTURE) IMPLANT
SUT PDS AB 1 TP1 96 (SUTURE) IMPLANT
SUTURE BOLSTER RTNT 3/16X1 3/4 (MISCELLANEOUS) IMPLANT
SWAB COLLECTION DEVICE MRSA (MISCELLANEOUS) IMPLANT
SWAB CULTURE ESWAB REG 1ML (MISCELLANEOUS) IMPLANT
TOWEL GREEN STERILE (TOWEL DISPOSABLE) ×1 IMPLANT
TOWEL GREEN STERILE FF (TOWEL DISPOSABLE) ×1 IMPLANT
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
## 2023-01-27 NOTE — Transfer of Care (Signed)
Immediate Anesthesia Transfer of Care Note  Patient: Derrick Hull  Procedure(s) Performed: IRRIGATION AND DEBRIDEMENT ABDOMEN WITH CLOSURE  Patient Location: ICU  Anesthesia Type:General  Level of Consciousness: sedated and Patient remains intubated per anesthesia plan  Airway & Oxygen Therapy: Patient remains intubated per anesthesia plan and Patient placed on Ventilator (see vital sign flow sheet for setting)  Post-op Assessment: Report given to RN and Post -op Vital signs reviewed and stable  Post vital signs: Reviewed and stable  Last Vitals:  Vitals Value Taken Time  BP    Temp    Pulse 89 01/27/23 1136  Resp 18 01/27/23 1136  SpO2 95 % 01/27/23 1136  Vitals shown include unvalidated device data.  Last Pain:  Vitals:   01/27/23 0725  TempSrc: Axillary  PainSc:          Complications: No notable events documented.
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
## 2023-01-27 NOTE — Anesthesia Postprocedure Evaluation (Signed)
Anesthesia Post Note  Patient: Derrick Hull  Procedure(s) Performed: IRRIGATION AND DEBRIDEMENT ABDOMEN WITH CLOSURE     Patient location during evaluation: SICU Anesthesia Type: General Level of consciousness: sedated Pain management: pain level controlled Vital Signs Assessment: post-procedure vital signs reviewed and stable Respiratory status: patient remains intubated per anesthesia plan Cardiovascular status: stable Postop Assessment: no apparent nausea or vomiting Anesthetic complications: no  No notable events documented.  Last Vitals:  Vitals:   01/27/23 1130 01/27/23 1200  BP: 134/85 139/84  Pulse: 89 88  Resp: 18 18  Temp: 36.8 C   SpO2: 96% 96%    Last Pain:  Vitals:   01/27/23 0725  TempSrc: Axillary  PainSc:                  Derrick Hull,W. EDMOND
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
## 2023-01-27 NOTE — Progress Notes (Signed)
Pt taken to OR for ex lap / wash out.
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
## 2023-01-27 NOTE — Anesthesia Preprocedure Evaluation (Addendum)
Anesthesia Evaluation  Patient identified by MRN, date of birth, ID band Patient unresponsive    Reviewed: Allergy & Precautions, H&P , NPO status , Patient's Chart, lab work & pertinent test results  Airway Mallampati: Intubated       Dental no notable dental hx. (+) Teeth Intact, Dental Advisory Given   Pulmonary neg pulmonary ROS Intubated and sedated On vent   Pulmonary exam normal breath sounds clear to auscultation       Cardiovascular negative cardio ROS  Rhythm:Regular Rate:Normal     Neuro/Psych negative neurological ROS  negative psych ROS   GI/Hepatic negative GI ROS, Neg liver ROS,,,  Endo/Other    Morbid obesity  Renal/GU negative Renal ROS  negative genitourinary   Musculoskeletal   Abdominal   Peds  Hematology negative hematology ROS (+)   Anesthesia Other Findings   Reproductive/Obstetrics negative OB ROS                             Anesthesia Physical Anesthesia Plan  ASA: 4  Anesthesia Plan: General   Post-op Pain Management:    Induction: Intravenous  PONV Risk Score and Plan: 2 and Treatment may vary due to age or medical condition  Airway Management Planned: Oral ETT  Additional Equipment:   Intra-op Plan:   Post-operative Plan: Post-operative intubation/ventilation  Informed Consent: I have reviewed the patients History and Physical, chart, labs and discussed the procedure including the risks, benefits and alternatives for the proposed anesthesia with the patient or authorized representative who has indicated his/her understanding and acceptance.     Dental advisory given  Plan Discussed with: CRNA  Anesthesia Plan Comments:        Anesthesia Quick Evaluation

## 2023-01-28 ENCOUNTER — Encounter (HOSPITAL_COMMUNITY): Payer: Self-pay | Admitting: Surgery

## 2023-01-28 MED ORDER — DEXMEDETOMIDINE HCL IN NACL 400 MCG/100ML IV SOLN
0.0000 ug/kg/h | INTRAVENOUS | Status: DC
  Administered 2023-01-28: 1 ug/kg/h via INTRAVENOUS
  Administered 2023-01-28: 0.4 ug/kg/h via INTRAVENOUS
  Administered 2023-01-28 (×2): 0.8 ug/kg/h via INTRAVENOUS
  Administered 2023-01-29 (×2): 1.2 ug/kg/h via INTRAVENOUS
  Administered 2023-01-29: 1.6 ug/kg/h via INTRAVENOUS
  Administered 2023-01-29 (×2): 1.4 ug/kg/h via INTRAVENOUS
  Administered 2023-01-29 (×3): 1.2 ug/kg/h via INTRAVENOUS
  Administered 2023-01-29: 1.4 ug/kg/h via INTRAVENOUS
  Administered 2023-01-29: 1.1 ug/kg/h via INTRAVENOUS
  Administered 2023-01-30: 1.5 ug/kg/h via INTRAVENOUS
  Administered 2023-01-30: 1.6 ug/kg/h via INTRAVENOUS
  Administered 2023-01-30 (×3): 1.5 ug/kg/h via INTRAVENOUS
  Administered 2023-01-30: 1.6 ug/kg/h via INTRAVENOUS
  Administered 2023-01-30: 1.5 ug/kg/h via INTRAVENOUS
  Administered 2023-01-30: 1.8 ug/kg/h via INTRAVENOUS
  Administered 2023-01-30: 1.5 ug/kg/h via INTRAVENOUS
  Administered 2023-01-30: 1.4 ug/kg/h via INTRAVENOUS
  Administered 2023-01-30 – 2023-01-31 (×2): 1.5 ug/kg/h via INTRAVENOUS
  Administered 2023-01-31: 2 ug/kg/h via INTRAVENOUS
  Administered 2023-01-31: 1.5 ug/kg/h via INTRAVENOUS
  Administered 2023-01-31: 1 ug/kg/h via INTRAVENOUS
  Administered 2023-01-31: 1.7 ug/kg/h via INTRAVENOUS
  Filled 2023-01-28: qty 100
  Filled 2023-01-28: qty 200
  Filled 2023-01-28 (×8): qty 100
  Filled 2023-01-28: qty 200
  Filled 2023-01-28 (×4): qty 100
  Filled 2023-01-28 (×2): qty 200
  Filled 2023-01-28 (×2): qty 100
  Filled 2023-01-28: qty 200
  Filled 2023-01-28 (×5): qty 100

## 2023-01-28 NOTE — TOC CAGE-AID Note (Signed)
Transition of Care [facility]) - CAGE-AID Screening   Patient Details  Name: Derrick Hull MRN: [id number] Date of Birth: 03-Feb-1981  Transition of Care [facility]) CM/SW Contact:    Leota Sauers, RN Phone Number: 01/28/2023, 12:33 AM   Clinical Narrative:  Patient currently intubated and unable to participate in screening.  CAGE-AID Screening: Substance Abuse Screening unable to be completed due to: : Patient unable to participate
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
## 2023-01-29 ENCOUNTER — Inpatient Hospital Stay (HOSPITAL_COMMUNITY): Payer: Medicaid Other

## 2023-01-29 LAB — GLUCOSE, CAPILLARY
Glucose-Capillary: 111 mg/dL — ABNORMAL HIGH (ref 70–99)
Glucose-Capillary: 113 mg/dL — ABNORMAL HIGH (ref 70–99)
Glucose-Capillary: 118 mg/dL — ABNORMAL HIGH (ref 70–99)
Glucose-Capillary: 115 mg/dL — ABNORMAL HIGH (ref 70–99)

## 2023-01-29 LAB — TRIGLYCERIDES: Triglycerides: 192 mg/dL — ABNORMAL HIGH (ref <150)

## 2023-01-29 MED ORDER — OXYCODONE HCL 5 MG PO TABS
5.0000 mg | ORAL_TABLET | ORAL | Status: DC | PRN
  Administered 2023-01-29 – 2023-02-06 (×17): 10 mg
  Filled 2023-01-29 (×17): qty 2

## 2023-01-29 MED ORDER — PIVOT 1.5 CAL PO LIQD
1000.0000 mL | ORAL | Status: DC
  Administered 2023-01-29 – 2023-01-31 (×3): 1000 mL

## 2023-01-29 MED ORDER — QUETIAPINE FUMARATE 25 MG PO TABS
50.0000 mg | ORAL_TABLET | Freq: Two times a day (BID) | ORAL | Status: DC
  Administered 2023-01-29 (×2): 50 mg
  Filled 2023-01-29 (×3): qty 2

## 2023-01-29 MED ORDER — VITAL HIGH PROTEIN PO LIQD: 1000.0000 mL | ORAL | Status: DC

## 2023-01-29 MED ORDER — MIDAZOLAM HCL 2 MG/2ML IJ SOLN
1.0000 mg | INTRAMUSCULAR | Status: DC | PRN
  Administered 2023-01-29: 2 mg via INTRAVENOUS
  Filled 2023-01-29: qty 2

## 2023-01-29 MED ORDER — ENOXAPARIN SODIUM 40 MG/0.4ML IJ SOSY
40.0000 mg | PREFILLED_SYRINGE | Freq: Two times a day (BID) | INTRAMUSCULAR | Status: DC
  Administered 2023-01-29 – 2023-02-07 (×18): 40 mg via SUBCUTANEOUS
  Filled 2023-01-29 (×18): qty 0.4

## 2023-01-29 MED ORDER — PROSOURCE TF20 ENFIT COMPATIBL EN LIQD
60.0000 mL | Freq: Every day | ENTERAL | Status: DC
  Administered 2023-01-29: 60 mL
  Filled 2023-01-29: qty 60
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
## 2023-01-29 NOTE — Procedures (Signed)
Cortrak  Tube Type:  Cortrak - 43 inches Tube Location:  Left nare Initial Placement:  Stomach Secured by: Bridle Technique Used to Measure Tube Placement:  Marking at nare/corner of mouth Cortrak Secured At:  70 cm   Cortrak Tube Team Note:  Consult received to place a Cortrak feeding tube.   X-ray is required, abdominal x-ray has been ordered by the Cortrak team. Please confirm tube placement before using the Cortrak tube.   If the tube becomes dislodged please keep the tube and contact the Cortrak team at [email or web address] for replacement.  If after hours and replacement cannot be delayed, place a NG tube and confirm placement with an abdominal x-ray.    Sharniece Gibbon MS, RD, LDN Please refer to AMION for RD and/or RD on-call/weekend/after hours pager   
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
## 2023-01-30 LAB — GLUCOSE, CAPILLARY
Glucose-Capillary: 109 mg/dL — ABNORMAL HIGH (ref 70–99)
Glucose-Capillary: 128 mg/dL — ABNORMAL HIGH (ref 70–99)
Glucose-Capillary: 139 mg/dL — ABNORMAL HIGH (ref 70–99)
Glucose-Capillary: 116 mg/dL — ABNORMAL HIGH (ref 70–99)
Glucose-Capillary: 147 mg/dL — ABNORMAL HIGH (ref 70–99)
Glucose-Capillary: 129 mg/dL — ABNORMAL HIGH (ref 70–99)

## 2023-01-30 MED ORDER — CLONAZEPAM 1 MG PO TABS
1.0000 mg | ORAL_TABLET | Freq: Two times a day (BID) | ORAL | Status: DC
  Administered 2023-01-30 – 2023-02-04 (×12): 1 mg
  Filled 2023-01-30 (×12): qty 1

## 2023-01-30 MED ORDER — QUETIAPINE FUMARATE 100 MG PO TABS
100.0000 mg | ORAL_TABLET | Freq: Two times a day (BID) | ORAL | Status: DC
  Administered 2023-01-30 – 2023-02-04 (×12): 100 mg
  Filled 2023-01-30 (×11): qty 1
  Filled 2023-01-30: qty 4

## 2023-01-30 NOTE — Progress Notes (Signed)
RN called d/t pt w/ increased WOB, pt was changed back to full vent support.

## 2023-01-30 NOTE — Progress Notes (Signed)
Increased PSV d/t increased WOB noted, and pt more diaphoretic on wean.  VSS currently.
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
## 2023-01-31 ENCOUNTER — Inpatient Hospital Stay (HOSPITAL_COMMUNITY): Payer: Medicaid Other

## 2023-01-31 LAB — GLUCOSE, CAPILLARY
Glucose-Capillary: 130 mg/dL — ABNORMAL HIGH (ref 70–99)
Glucose-Capillary: 120 mg/dL — ABNORMAL HIGH (ref 70–99)
Glucose-Capillary: 127 mg/dL — ABNORMAL HIGH (ref 70–99)
Glucose-Capillary: 102 mg/dL — ABNORMAL HIGH (ref 70–99)
Glucose-Capillary: 105 mg/dL — ABNORMAL HIGH (ref 70–99)
Glucose-Capillary: 122 mg/dL — ABNORMAL HIGH (ref 70–99)

## 2023-01-31 LAB — BASIC METABOLIC PANEL
Anion gap: 6 (ref 5–15)
BUN: 9 mg/dL (ref 6–20)
CO2: 26 mmol/L (ref 22–32)
Calcium: 8 mg/dL — ABNORMAL LOW (ref 8.9–10.3)
Chloride: 109 mmol/L (ref 98–111)
Creatinine, Ser: 0.82 mg/dL (ref 0.61–1.24)
GFR, Estimated: >60 mL/min (ref >60)
Glucose, Bld: 123 mg/dL — ABNORMAL HIGH (ref 70–99)
Potassium: 3.8 mmol/L (ref 3.5–5.1)
Sodium: 141 mmol/L (ref 135–145)

## 2023-01-31 LAB — CBC
HCT: 33.6 % — ABNORMAL LOW (ref 39.0–52.0)
Hemoglobin: 10.2 g/dL — ABNORMAL LOW (ref 13.0–17.0)
MCH: 28.6 pg (ref 26.0–34.0)
MCHC: 30.4 g/dL (ref 30.0–36.0)
MCV: 94.1 fL (ref 80.0–100.0)
Platelets: 327 10*3/uL (ref 150–400)
RBC: 3.57 MIL/uL — ABNORMAL LOW (ref 4.22–5.81)
RDW: 15 % (ref 11.5–15.5)
WBC: 10.1 10*3/uL (ref 4.0–10.5)
nRBC: 0 % (ref 0.0–0.2)

## 2023-01-31 MED ORDER — LOSARTAN POTASSIUM 50 MG PO TABS: 100.0000 mg | ORAL_TABLET | Freq: Every day | ORAL | Status: DC

## 2023-01-31 MED ORDER — METOPROLOL TARTRATE 5 MG/5ML IV SOLN
10.0000 mg | INTRAVENOUS | Status: DC | PRN
  Administered 2023-01-31: 10 mg via INTRAVENOUS
  Filled 2023-01-31: qty 10

## 2023-01-31 MED ORDER — LABETALOL HCL 5 MG/ML IV SOLN
20.0000 mg | INTRAVENOUS | Status: DC | PRN
  Administered 2023-01-31 – 2023-02-01 (×2): 20 mg via INTRAVENOUS
  Filled 2023-01-31 (×2): qty 4

## 2023-01-31 MED ORDER — OXYCODONE HCL 5 MG PO TABS
10.0000 mg | ORAL_TABLET | Freq: Four times a day (QID) | ORAL | Status: DC
  Administered 2023-01-31 – 2023-02-03 (×12): 10 mg
  Filled 2023-01-31 (×12): qty 2

## 2023-01-31 MED ORDER — PIVOT 1.5 CAL PO LIQD
1000.0000 mL | ORAL | Status: DC
  Administered 2023-02-01 – 2023-02-03 (×4): 1000 mL

## 2023-01-31 MED ORDER — LACTATED RINGERS IV BOLUS
1000.0000 mL | Freq: Once | INTRAVENOUS | Status: AC
  Administered 2023-01-31: 1000 mL via INTRAVENOUS

## 2023-01-31 MED ORDER — LOSARTAN POTASSIUM 50 MG PO TABS
100.0000 mg | ORAL_TABLET | Freq: Every day | ORAL | Status: DC
  Administered 2023-01-31 – 2023-02-06 (×7): 100 mg
  Filled 2023-01-31 (×7): qty 2

## 2023-01-31 MED ORDER — KETOROLAC TROMETHAMINE 15 MG/ML IJ SOLN
30.0000 mg | Freq: Four times a day (QID) | INTRAMUSCULAR | Status: AC
  Administered 2023-01-31 – 2023-02-05 (×20): 30 mg via INTRAVENOUS
  Filled 2023-01-31 (×20): qty 2

## 2023-01-31 MED ORDER — HYDROCHLOROTHIAZIDE 25 MG PO TABS
25.0000 mg | ORAL_TABLET | Freq: Every day | ORAL | Status: DC
  Administered 2023-01-31 – 2023-02-06 (×7): 25 mg
  Filled 2023-01-31 (×8): qty 1

## 2023-01-31 MED ORDER — MORPHINE SULFATE (PF) 2 MG/ML IV SOLN
2.0000 mg | INTRAVENOUS | Status: DC | PRN
  Administered 2023-01-31: 4 mg via INTRAVENOUS
  Administered 2023-01-31 (×2): 2 mg via INTRAVENOUS
  Administered 2023-01-31 – 2023-02-03 (×4): 4 mg via INTRAVENOUS
  Administered 2023-02-03: 2 mg via INTRAVENOUS
  Filled 2023-01-31: qty 1
  Filled 2023-01-31 (×2): qty 2
  Filled 2023-01-31: qty 1
  Filled 2023-01-31: qty 2
  Filled 2023-01-31: qty 1
  Filled 2023-01-31 (×3): qty 2

## 2023-01-31 MED ORDER — LABETALOL HCL 5 MG/ML IV SOLN: 20.0000 mg | INTRAVENOUS | Status: DC | PRN

## 2023-01-31 MED ORDER — METHOCARBAMOL 500 MG PO TABS
1000.0000 mg | ORAL_TABLET | Freq: Three times a day (TID) | ORAL | Status: DC
  Administered 2023-01-31 – 2023-02-06 (×17): 1000 mg
  Filled 2023-01-31 (×17): qty 2
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
## 2023-01-31 NOTE — Procedures (Signed)
Extubation Procedure Note  Patient Details:   Name: Derrick Hull DOB: Jul 24, 1980 MRN: [id number]   Airway Documentation:    Vent end date: 01/31/23 Vent end time: 1226   Evaluation  O2 sats: stable throughout Complications: No apparent complications Patient did tolerate procedure well. Bilateral Breath Sounds: Clear, Diminished   Yes  Pt extubated per order to 3L . Pt had a positive cuff leak, good strong cough, able to state name and no stridor noted. RT will monitor as needed.   Kerri Perches 01/31/2023, 12:27 PM
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
## 2023-02-01 LAB — TRIGLYCERIDES: Triglycerides: 182 mg/dL — ABNORMAL HIGH (ref <150)

## 2023-02-01 LAB — CBC
HCT: 44.9 % (ref 39.0–52.0)
Hemoglobin: 14.5 g/dL (ref 13.0–17.0)
MCH: 29.1 pg (ref 26.0–34.0)
MCHC: 32.3 g/dL (ref 30.0–36.0)
MCV: 90 fL (ref 80.0–100.0)
Platelets: 448 10*3/uL — ABNORMAL HIGH (ref 150–400)
RBC: 4.99 MIL/uL (ref 4.22–5.81)
RDW: 14.7 % (ref 11.5–15.5)
WBC: 12.9 10*3/uL — ABNORMAL HIGH (ref 4.0–10.5)
nRBC: 0 % (ref 0.0–0.2)

## 2023-02-01 LAB — GLUCOSE, CAPILLARY
Glucose-Capillary: 144 mg/dL — ABNORMAL HIGH (ref 70–99)
Glucose-Capillary: 147 mg/dL — ABNORMAL HIGH (ref 70–99)
Glucose-Capillary: 148 mg/dL — ABNORMAL HIGH (ref 70–99)
Glucose-Capillary: 148 mg/dL — ABNORMAL HIGH (ref 70–99)
Glucose-Capillary: 154 mg/dL — ABNORMAL HIGH (ref 70–99)
Glucose-Capillary: 177 mg/dL — ABNORMAL HIGH (ref 70–99)

## 2023-02-01 LAB — BASIC METABOLIC PANEL
Anion gap: 14 (ref 5–15)
BUN: 11 mg/dL (ref 6–20)
CO2: 22 mmol/L (ref 22–32)
Calcium: 8.6 mg/dL — ABNORMAL LOW (ref 8.9–10.3)
Chloride: 104 mmol/L (ref 98–111)
Creatinine, Ser: 0.7 mg/dL (ref 0.61–1.24)
GFR, Estimated: >60 mL/min (ref >60)
Glucose, Bld: 135 mg/dL — ABNORMAL HIGH (ref 70–99)
Potassium: 4.8 mmol/L (ref 3.5–5.1)
Sodium: 140 mmol/L (ref 135–145)

## 2023-02-01 MED ORDER — LORAZEPAM 1 MG PO TABS
1.0000 mg | ORAL_TABLET | Freq: Four times a day (QID) | ORAL | Status: AC
  Administered 2023-02-01 – 2023-02-02 (×6): 1 mg
  Filled 2023-02-01 (×6): qty 1

## 2023-02-01 MED ORDER — BETHANECHOL CHLORIDE 25 MG PO TABS
25.0000 mg | ORAL_TABLET | Freq: Three times a day (TID) | ORAL | Status: DC
  Administered 2023-02-01 – 2023-02-03 (×7): 25 mg
  Filled 2023-02-01 (×7): qty 1

## 2023-02-01 MED ORDER — LOPERAMIDE HCL 2 MG PO CAPS: 2.0000 mg | ORAL_CAPSULE | ORAL | Status: DC | PRN

## 2023-02-01 MED ORDER — AMLODIPINE BESYLATE 5 MG PO TABS
5.0000 mg | ORAL_TABLET | Freq: Every day | ORAL | Status: DC
  Administered 2023-02-01: 5 mg
  Filled 2023-02-01: qty 1

## 2023-02-01 MED ORDER — ORAL CARE MOUTH RINSE: 15.0000 mL | OROMUCOSAL | Status: DC | PRN

## 2023-02-01 MED ORDER — DOCUSATE SODIUM 50 MG/5ML PO LIQD
100.0000 mg | Freq: Two times a day (BID) | ORAL | Status: DC
  Administered 2023-02-01 – 2023-02-03 (×5): 100 mg
  Filled 2023-02-01 (×8): qty 10

## 2023-02-01 MED ORDER — THIAMINE MONONITRATE 100 MG PO TABS: 100.0000 mg | ORAL_TABLET | Freq: Every day | ORAL | Status: DC

## 2023-02-01 MED ORDER — FOLIC ACID 1 MG PO TABS
1.0000 mg | ORAL_TABLET | Freq: Every day | ORAL | Status: DC
  Administered 2023-02-01 – 2023-02-06 (×6): 1 mg
  Filled 2023-02-01 (×6): qty 1

## 2023-02-01 MED ORDER — ADULT MULTIVITAMIN W/MINERALS CH: 1.0000 | ORAL_TABLET | Freq: Every day | ORAL | Status: DC

## 2023-02-01 MED ORDER — HYDROXYZINE HCL 25 MG PO TABS: 25.0000 mg | ORAL_TABLET | Freq: Four times a day (QID) | ORAL | Status: DC | PRN

## 2023-02-01 MED ORDER — MIDAZOLAM HCL 2 MG/2ML IJ SOLN: 1.0000 mg | INTRAMUSCULAR | Status: DC | PRN

## 2023-02-01 MED ORDER — ADULT MULTIVITAMIN W/MINERALS CH
1.0000 | ORAL_TABLET | Freq: Every day | ORAL | Status: DC
  Administered 2023-02-01 – 2023-02-05 (×5): 1
  Filled 2023-02-01 (×5): qty 1

## 2023-02-01 MED ORDER — CLEVIDIPINE BUTYRATE 0.5 MG/ML IV EMUL
0.0000 mg/h | INTRAVENOUS | Status: DC
  Administered 2023-02-01: 12 mg/h via INTRAVENOUS
  Administered 2023-02-01 (×2): 21 mg/h via INTRAVENOUS
  Administered 2023-02-01: 16 mg/h via INTRAVENOUS
  Administered 2023-02-01: 21 mg/h via INTRAVENOUS
  Administered 2023-02-01: 2 mg/h via INTRAVENOUS
  Administered 2023-02-01: 16 mg/h via INTRAVENOUS
  Administered 2023-02-01: 20 mg/h via INTRAVENOUS
  Administered 2023-02-01: 21 mg/h via INTRAVENOUS
  Administered 2023-02-02: 10 mg/h via INTRAVENOUS
  Administered 2023-02-02: 20 mg/h via INTRAVENOUS
  Administered 2023-02-02: 21 mg/h via INTRAVENOUS
  Administered 2023-02-02: 20 mg/h via INTRAVENOUS
  Administered 2023-02-02: 17 mg/h via INTRAVENOUS
  Administered 2023-02-02: 21 mg/h via INTRAVENOUS
  Filled 2023-02-01 (×6): qty 100
  Filled 2023-02-01: qty 50
  Filled 2023-02-01 (×7): qty 100

## 2023-02-01 MED ORDER — LORAZEPAM 1 MG PO TABS: 1.0000 mg | ORAL_TABLET | Freq: Every day | ORAL | Status: DC

## 2023-02-01 MED ORDER — LORAZEPAM 1 MG PO TABS: 1.0000 mg | ORAL_TABLET | Freq: Four times a day (QID) | ORAL | Status: DC | PRN

## 2023-02-01 MED ORDER — LORAZEPAM 1 MG PO TABS
1.0000 mg | ORAL_TABLET | Freq: Three times a day (TID) | ORAL | Status: DC
  Administered 2023-02-02 – 2023-02-03 (×2): 1 mg
  Filled 2023-02-01 (×2): qty 1

## 2023-02-01 MED ORDER — THIAMINE MONONITRATE 100 MG PO TABS
100.0000 mg | ORAL_TABLET | Freq: Every day | ORAL | Status: DC
  Administered 2023-02-02 – 2023-02-06 (×5): 100 mg
  Filled 2023-02-01 (×5): qty 1

## 2023-02-01 MED ORDER — POLYETHYLENE GLYCOL 3350 17 G PO PACK
17.0000 g | PACK | Freq: Every day | ORAL | Status: DC
  Administered 2023-02-01 – 2023-02-03 (×3): 17 g
  Filled 2023-02-01 (×5): qty 1

## 2023-02-01 MED ORDER — THIAMINE HCL 100 MG/ML IJ SOLN
100.0000 mg | Freq: Once | INTRAMUSCULAR | Status: AC
  Administered 2023-02-01: 100 mg via INTRAMUSCULAR
  Filled 2023-02-01: qty 2

## 2023-02-01 MED ORDER — METOPROLOL TARTRATE 5 MG/5ML IV SOLN
10.0000 mg | Freq: Four times a day (QID) | INTRAVENOUS | Status: DC | PRN
  Administered 2023-02-01 – 2023-02-02 (×2): 10 mg via INTRAVENOUS
  Filled 2023-02-01 (×3): qty 10

## 2023-02-01 MED ORDER — LORAZEPAM 1 MG PO TABS
1.0000 mg | ORAL_TABLET | Freq: Two times a day (BID) | ORAL | Status: DC
  Administered 2023-02-04: 1 mg
  Filled 2023-02-01 (×2): qty 1

## 2023-02-01 MED ORDER — TAMSULOSIN HCL 0.4 MG PO CAPS: 0.4000 mg | ORAL_CAPSULE | Freq: Every day | ORAL | Status: DC

## 2023-02-01 MED ORDER — CLONIDINE HCL 0.1 MG PO TABS
0.1000 mg | ORAL_TABLET | Freq: Two times a day (BID) | ORAL | Status: DC
  Administered 2023-02-01 (×2): 0.1 mg
  Filled 2023-02-01 (×2): qty 1

## 2023-02-01 NOTE — Progress Notes (Signed)
SLP Cancellation Note  Patient Details Name: Derrick Hull MRN: [id number] DOB: April 15, 1981   Cancelled treatment:       Reason Eval/Treat Not Completed: Fatigue/lethargy limiting ability to participate  Per RN patient not alert enough to participate in swallowing evaluation today.  ST will continue efforts.  Dimas Aguas, MA, CCC-SLP Acute Rehab SLP [phone]  Fleet Contras 02/01/2023, 1:58 PM
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
## 2023-02-01 NOTE — Progress Notes (Signed)
On morning examination patient was found to be profusely diaphoretic with a slight tremor, tachycardic, and anxious (patient asked this RN to "not let him die" a couple times while in the room). Pt is normothermic at this time.  This RN asked the patient if he uses any recreational drugs and/or alcohol. The patient admitted to using cocaine and marijuana daily and drinking 2-3 x a week "a bottle of [address]   Will report this to Trauma MD. This RN performed a CIWA screen on the patient.

## 2023-02-02 LAB — CBC
HCT: 38.9 % — ABNORMAL LOW (ref 39.0–52.0)
Hemoglobin: 12.5 g/dL — ABNORMAL LOW (ref 13.0–17.0)
MCH: 29.4 pg (ref 26.0–34.0)
MCHC: 32.1 g/dL (ref 30.0–36.0)
MCV: 91.5 fL (ref 80.0–100.0)
Platelets: 496 10*3/uL — ABNORMAL HIGH (ref 150–400)
RBC: 4.25 MIL/uL (ref 4.22–5.81)
RDW: 14.9 % (ref 11.5–15.5)
WBC: 12.6 10*3/uL — ABNORMAL HIGH (ref 4.0–10.5)
nRBC: 0 % (ref 0.0–0.2)

## 2023-02-02 LAB — GLUCOSE, CAPILLARY
Glucose-Capillary: 150 mg/dL — ABNORMAL HIGH (ref 70–99)
Glucose-Capillary: 143 mg/dL — ABNORMAL HIGH (ref 70–99)
Glucose-Capillary: 143 mg/dL — ABNORMAL HIGH (ref 70–99)
Glucose-Capillary: 149 mg/dL — ABNORMAL HIGH (ref 70–99)
Glucose-Capillary: 140 mg/dL — ABNORMAL HIGH (ref 70–99)

## 2023-02-02 LAB — BASIC METABOLIC PANEL
Anion gap: 9 (ref 5–15)
BUN: 12 mg/dL (ref 6–20)
CO2: 30 mmol/L (ref 22–32)
Calcium: 8.9 mg/dL (ref 8.9–10.3)
Chloride: 105 mmol/L (ref 98–111)
Creatinine, Ser: 0.73 mg/dL (ref 0.61–1.24)
GFR, Estimated: >60 mL/min (ref >60)
Glucose, Bld: 145 mg/dL — ABNORMAL HIGH (ref 70–99)
Potassium: 3.2 mmol/L — ABNORMAL LOW (ref 3.5–5.1)
Sodium: 144 mmol/L (ref 135–145)

## 2023-02-02 MED ORDER — POTASSIUM CHLORIDE 10 MEQ/100ML IV SOLN
10.0000 meq | INTRAVENOUS | Status: AC
  Administered 2023-02-02 (×3): 10 meq via INTRAVENOUS
  Filled 2023-02-02 (×3): qty 100

## 2023-02-02 MED ORDER — FREE WATER
100.0000 mL | Freq: Four times a day (QID) | Status: DC
  Administered 2023-02-02 – 2023-02-05 (×11): 100 mL

## 2023-02-02 MED ORDER — INSULIN ASPART 100 UNIT/ML IJ SOLN
0.0000 [IU] | INTRAMUSCULAR | Status: DC
  Administered 2023-02-02 (×5): 2 [IU] via SUBCUTANEOUS
  Administered 2023-02-02: 3 [IU] via SUBCUTANEOUS
  Administered 2023-02-03 – 2023-02-05 (×14): 2 [IU] via SUBCUTANEOUS

## 2023-02-02 MED ORDER — LABETALOL HCL 5 MG/ML IV SOLN: 20.0000 mg | INTRAVENOUS | Status: DC | PRN

## 2023-02-02 MED ORDER — AMLODIPINE BESYLATE 10 MG PO TABS
10.0000 mg | ORAL_TABLET | Freq: Every day | ORAL | Status: DC
  Administered 2023-02-02 – 2023-02-06 (×5): 10 mg
  Filled 2023-02-02 (×6): qty 1

## 2023-02-02 MED ORDER — CLONIDINE HCL 0.2 MG PO TABS
0.2000 mg | ORAL_TABLET | Freq: Two times a day (BID) | ORAL | Status: DC
  Administered 2023-02-02 – 2023-02-04 (×6): 0.2 mg
  Filled 2023-02-02 (×6): qty 1

## 2023-02-02 MED ORDER — MAGNESIUM HYDROXIDE 400 MG/5ML PO SUSP
30.0000 mL | Freq: Once | ORAL | Status: AC
  Administered 2023-02-02: 30 mL via ORAL
  Filled 2023-02-02: qty 30
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
## 2023-02-02 NOTE — Progress Notes (Signed)
Inpatient Rehab Admissions Coordinator Note:   Per PT patient was screened for CIR candidacy by Zuleika Gallus Luvenia Starch, CCC-SLP. At this time, pt has not yet attempted transfers. Pt may have potential to progress to becoming a potential CIR candidate. CIR admissions team will follow to monitor progress and participation with  therapies. A consult order will be placed if pt appears to be an appropriate candidate.    Wolfgang Phoenix, MS, CCC-SLP Admissions Coordinator [phone] 02/02/23 5:20 PM

## 2023-02-03 ENCOUNTER — Other Ambulatory Visit: Payer: Self-pay

## 2023-02-03 ENCOUNTER — Inpatient Hospital Stay (HOSPITAL_COMMUNITY): Payer: Medicaid Other

## 2023-02-03 LAB — BASIC METABOLIC PANEL
Anion gap: 9 (ref 5–15)
BUN: 23 mg/dL — ABNORMAL HIGH (ref 6–20)
CO2: 30 mmol/L (ref 22–32)
Calcium: 9.2 mg/dL (ref 8.9–10.3)
Chloride: 106 mmol/L (ref 98–111)
Creatinine, Ser: 0.86 mg/dL (ref 0.61–1.24)
GFR, Estimated: >60 mL/min (ref >60)
Glucose, Bld: 140 mg/dL — ABNORMAL HIGH (ref 70–99)
Potassium: 3.3 mmol/L — ABNORMAL LOW (ref 3.5–5.1)
Sodium: 145 mmol/L (ref 135–145)

## 2023-02-03 LAB — GLUCOSE, CAPILLARY
Glucose-Capillary: 124 mg/dL — ABNORMAL HIGH (ref 70–99)
Glucose-Capillary: 149 mg/dL — ABNORMAL HIGH (ref 70–99)
Glucose-Capillary: 150 mg/dL — ABNORMAL HIGH (ref 70–99)
Glucose-Capillary: 141 mg/dL — ABNORMAL HIGH (ref 70–99)
Glucose-Capillary: 126 mg/dL — ABNORMAL HIGH (ref 70–99)
Glucose-Capillary: 126 mg/dL — ABNORMAL HIGH (ref 70–99)
Glucose-Capillary: 130 mg/dL — ABNORMAL HIGH (ref 70–99)

## 2023-02-03 LAB — CBC
HCT: 39.3 % (ref 39.0–52.0)
Hemoglobin: 12.3 g/dL — ABNORMAL LOW (ref 13.0–17.0)
MCH: 28.1 pg (ref 26.0–34.0)
MCHC: 31.3 g/dL (ref 30.0–36.0)
MCV: 89.9 fL (ref 80.0–100.0)
Platelets: 551 10*3/uL — ABNORMAL HIGH (ref 150–400)
RBC: 4.37 MIL/uL (ref 4.22–5.81)
RDW: 15 % (ref 11.5–15.5)
WBC: 13.1 10*3/uL — ABNORMAL HIGH (ref 4.0–10.5)
nRBC: 0 % (ref 0.0–0.2)

## 2023-02-03 LAB — MAGNESIUM: Magnesium: 2.1 mg/dL (ref 1.7–2.4)

## 2023-02-03 MED ORDER — METOPROLOL TARTRATE 25 MG PO TABS: 25.0000 mg | ORAL_TABLET | Freq: Two times a day (BID) | ORAL | Status: DC

## 2023-02-03 MED ORDER — LORAZEPAM 1 MG PO TABS: 1.0000 mg | ORAL_TABLET | Freq: Four times a day (QID) | ORAL | Status: AC | PRN

## 2023-02-03 MED ORDER — PIVOT 1.5 CAL PO LIQD
1000.0000 mL | ORAL | Status: DC
  Administered 2023-02-03: 1000 mL

## 2023-02-03 MED ORDER — ORAL CARE MOUTH RINSE
15.0000 mL | OROMUCOSAL | Status: DC
  Administered 2023-02-04 – 2023-02-05 (×6): 15 mL via OROMUCOSAL

## 2023-02-03 MED ORDER — ORAL CARE MOUTH RINSE: 15.0000 mL | OROMUCOSAL | Status: DC | PRN

## 2023-02-03 MED ORDER — SODIUM CHLORIDE 0.9% FLUSH
10.0000 mL | Freq: Two times a day (BID) | INTRAVENOUS | Status: DC
  Administered 2023-02-03: 20 mL
  Administered 2023-02-03: 10 mL
  Administered 2023-02-04 (×2): 20 mL
  Administered 2023-02-05 – 2023-02-07 (×5): 10 mL

## 2023-02-03 MED ORDER — CARVEDILOL 12.5 MG PO TABS
12.5000 mg | ORAL_TABLET | Freq: Two times a day (BID) | ORAL | Status: DC
  Administered 2023-02-03 – 2023-02-05 (×4): 12.5 mg
  Filled 2023-02-03 (×4): qty 1

## 2023-02-03 MED ORDER — HYDROXYZINE HCL 25 MG PO TABS: 25.0000 mg | ORAL_TABLET | Freq: Four times a day (QID) | ORAL | Status: AC | PRN

## 2023-02-03 MED ORDER — POTASSIUM CHLORIDE 10 MEQ/100ML IV SOLN
10.0000 meq | INTRAVENOUS | Status: AC
  Administered 2023-02-03 (×6): 10 meq via INTRAVENOUS
  Filled 2023-02-03 (×6): qty 100

## 2023-02-03 MED ORDER — BISACODYL 10 MG RE SUPP
10.0000 mg | Freq: Once | RECTAL | Status: AC
  Administered 2023-02-03: 10 mg via RECTAL
  Filled 2023-02-03: qty 1

## 2023-02-03 MED ORDER — SODIUM CHLORIDE 0.9% FLUSH: 10.0000 mL | INTRAVENOUS | Status: DC | PRN

## 2023-02-03 MED ORDER — CARVEDILOL 12.5 MG PO TABS: 12.5000 mg | ORAL_TABLET | Freq: Two times a day (BID) | ORAL | Status: DC

## 2023-02-03 MED ORDER — OXYCODONE HCL 5 MG PO TABS
5.0000 mg | ORAL_TABLET | Freq: Four times a day (QID) | ORAL | Status: DC
  Administered 2023-02-03 – 2023-02-06 (×12): 5 mg
  Filled 2023-02-03 (×12): qty 1

## 2023-02-03 MED ORDER — TAMSULOSIN HCL 0.4 MG PO CAPS
0.4000 mg | ORAL_CAPSULE | Freq: Every day | ORAL | Status: DC
  Administered 2023-02-03 – 2023-02-07 (×4): 0.4 mg via ORAL
  Filled 2023-02-03 (×4): qty 1
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
## 2023-02-03 NOTE — Progress Notes (Signed)
Peripherally Inserted Central Catheter Placement  The IV Nurse has discussed with the patient and/or persons authorized to consent for the patient, the purpose of this procedure and the potential benefits and risks involved with this procedure.  The benefits include less needle sticks, lab draws from the catheter, and the patient may be discharged home with the catheter. Risks include, but not limited to, infection, bleeding, blood clot (thrombus formation), and puncture of an artery; nerve damage and irregular heartbeat and possibility to perform a PICC exchange if needed/ordered by physician.  Alternatives to this procedure were also discussed.  Bard Power PICC patient education guide, fact sheet on infection prevention and patient information card has been provided to patient /or left at bedside.    PICC Placement Documentation  PICC Double Lumen 02/03/23 Right Basilic 44 cm 0 cm (Active)  Indication for Insertion or Continuance of Line Administration of hyperosmolar/irritating solutions (i.e. TPN, Vancomycin, etc.) 02/03/23 1339  Exposed Catheter (cm) 0 cm 02/03/23 1339  Site Assessment Clean, Dry, Intact 02/03/23 1339  Lumen #1 Status Flushed;Blood return noted;Saline locked 02/03/23 1339  Lumen #2 Status Flushed;Saline locked;Blood return noted 02/03/23 1339  Dressing Type Transparent;Securing device 02/03/23 1339  Dressing Status Antimicrobial disc in place 02/03/23 1339  Safety Lock Not Applicable 02/03/23 1339  Line Adjustment (NICU/IV Team Only) No 02/03/23 1339  Dressing Change Due 02/10/23 02/03/23 1339       Romie Jumper 02/03/2023, 1:46 PM

## 2023-02-03 NOTE — Progress Notes (Signed)
PT Cancellation Note  Patient Details Name: Derrick Hull MRN: [id number] DOB: [birth year]   Cancelled Treatment:    Reason Eval/Treat Not Completed: Other (comment) Pt with recent episode of projectile vomiting.  Lillia Pauls, PT, DPT Acute Rehabilitation Services Office [phone]    Norval Morton 02/03/2023, 9:25 AM
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
## 2023-02-03 NOTE — Progress Notes (Signed)
SLP Cancellation Note  Patient Details Name: Derrick Hull MRN: [id number] DOB: [birth year]   Cancelled treatment:       Reason Eval/Treat Not Completed: Patient's level of consciousness;Fatigue/lethargy limiting ability to participate. Patient asleep, mouth breathing and snoring, RR in range of 30-36. He was able to open eyes briefly when SLP attempted to rouse him but was not able to maintain this alertness. He was able to perform a very weak, non productive cough when cued by SLP. SLP will f/u next date.   Angela Nevin, MA, CCC-SLP Speech Therapy

## 2023-02-03 NOTE — Progress Notes (Signed)
Inpatient Rehab Admissions Coordinator:   Following from a distance for CIR.  Pt continues to require significant assist for limited mobility.  Not yet ready to tolerate CIR.    Estill Dooms, PT, DPT Admissions Coordinator [phone] 02/03/23  11:40 AM

## 2023-02-03 NOTE — Progress Notes (Signed)
OT Cancellation Note  Patient Details Name: Derrick Hull MRN: [id number] DOB: [birth year]   Cancelled Treatment:    Reason Eval/Treat Not Completed: Other (comment)- pt will recent episode of projectile vomiting. OT will follow and see as able.   Barry Brunner, OT Acute Rehabilitation Services Office [phone]   Chancy Milroy 02/03/2023, 9:26 AM

## 2023-02-03 NOTE — Progress Notes (Signed)
Telephone consent obtained from wife due to patient being drowsy and not able to answer questions.  Patient nodded head when asked if it was okay to discuss with his wife and get consent from her.  Witnessed by Arlina Robes, RN.

## 2023-02-04 LAB — GLUCOSE, CAPILLARY
Glucose-Capillary: 124 mg/dL — ABNORMAL HIGH (ref 70–99)
Glucose-Capillary: 121 mg/dL — ABNORMAL HIGH (ref 70–99)
Glucose-Capillary: 130 mg/dL — ABNORMAL HIGH (ref 70–99)
Glucose-Capillary: 122 mg/dL — ABNORMAL HIGH (ref 70–99)
Glucose-Capillary: 127 mg/dL — ABNORMAL HIGH (ref 70–99)
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
## 2023-02-05 DIAGNOSIS — Z9889 Other specified postprocedural states: Secondary | ICD-10-CM

## 2023-02-05 LAB — BASIC METABOLIC PANEL
Anion gap: 7 (ref 5–15)
BUN: 25 mg/dL — ABNORMAL HIGH (ref 6–20)
CO2: 34 mmol/L — ABNORMAL HIGH (ref 22–32)
Calcium: 9.1 mg/dL (ref 8.9–10.3)
Chloride: 100 mmol/L (ref 98–111)
Creatinine, Ser: 0.88 mg/dL (ref 0.61–1.24)
GFR, Estimated: >60 mL/min (ref >60)
Glucose, Bld: 113 mg/dL — ABNORMAL HIGH (ref 70–99)
Potassium: 4.4 mmol/L (ref 3.5–5.1)
Sodium: 141 mmol/L (ref 135–145)

## 2023-02-05 LAB — CBC
HCT: 37.1 % — ABNORMAL LOW (ref 39.0–52.0)
Hemoglobin: 11.6 g/dL — ABNORMAL LOW (ref 13.0–17.0)
MCH: 28.4 pg (ref 26.0–34.0)
MCHC: 31.3 g/dL (ref 30.0–36.0)
MCV: 90.7 fL (ref 80.0–100.0)
Platelets: 567 10*3/uL — ABNORMAL HIGH (ref 150–400)
RBC: 4.09 MIL/uL — ABNORMAL LOW (ref 4.22–5.81)
RDW: 14.8 % (ref 11.5–15.5)
WBC: 11.8 10*3/uL — ABNORMAL HIGH (ref 4.0–10.5)
nRBC: 0 % (ref 0.0–0.2)

## 2023-02-05 LAB — GLUCOSE, CAPILLARY
Glucose-Capillary: 100 mg/dL — ABNORMAL HIGH (ref 70–99)
Glucose-Capillary: 102 mg/dL — ABNORMAL HIGH (ref 70–99)
Glucose-Capillary: 110 mg/dL — ABNORMAL HIGH (ref 70–99)
Glucose-Capillary: 110 mg/dL — ABNORMAL HIGH (ref 70–99)
Glucose-Capillary: 122 mg/dL — ABNORMAL HIGH (ref 70–99)
Glucose-Capillary: 133 mg/dL — ABNORMAL HIGH (ref 70–99)

## 2023-02-05 MED ORDER — CLONAZEPAM 0.5 MG PO TABS
0.5000 mg | ORAL_TABLET | Freq: Two times a day (BID) | ORAL | Status: DC
  Administered 2023-02-05 – 2023-02-06 (×3): 0.5 mg
  Filled 2023-02-05 (×3): qty 1

## 2023-02-05 MED ORDER — CLONAZEPAM 0.5 MG PO TABS: 0.5000 mg | ORAL_TABLET | Freq: Two times a day (BID) | ORAL | Status: DC

## 2023-02-05 MED ORDER — ENSURE ENLIVE PO LIQD
237.0000 mL | Freq: Three times a day (TID) | ORAL | Status: DC
  Administered 2023-02-05 – 2023-02-07 (×4): 237 mL via ORAL

## 2023-02-05 MED ORDER — ADULT MULTIVITAMIN W/MINERALS CH
1.0000 | ORAL_TABLET | Freq: Every day | ORAL | Status: DC
  Administered 2023-02-06 – 2023-02-07 (×2): 1 via ORAL
  Filled 2023-02-05 (×2): qty 1

## 2023-02-05 MED ORDER — CARVEDILOL 25 MG PO TABS
25.0000 mg | ORAL_TABLET | Freq: Two times a day (BID) | ORAL | Status: DC
  Administered 2023-02-05 – 2023-02-06 (×3): 25 mg
  Filled 2023-02-05 (×4): qty 1

## 2023-02-05 MED ORDER — QUETIAPINE FUMARATE 25 MG PO TABS
50.0000 mg | ORAL_TABLET | Freq: Two times a day (BID) | ORAL | Status: DC
  Administered 2023-02-05 – 2023-02-06 (×4): 50 mg
  Filled 2023-02-05 (×5): qty 2

## 2023-02-05 MED ORDER — QUETIAPINE FUMARATE 25 MG PO TABS: 50.0000 mg | ORAL_TABLET | Freq: Two times a day (BID) | ORAL | Status: DC

## 2023-02-05 NOTE — Discharge Instructions (Addendum)
CC[facility]      [facility], [place] [phone]  OPEN ABDOMINAL SURGERY: POST OP INSTRUCTIONS  Always review your discharge instruction sheet given to you by the facility where your surgery was performed.  IF YOU HAVE DISABILITY OR FAMILY LEAVE FORMS, YOU MUST BRING THEM TO THE OFFICE FOR PROCESSING.  PLEASE DO NOT GIVE THEM TO YOUR DOCTOR.  A prescription for pain medication may be given to you upon discharge.  Take your pain medication as prescribed, if needed.  If narcotic pain medicine is not needed, then you may take acetaminophen (Tylenol) or ibuprofen (Advil) as needed. Take your usually prescribed medications unless otherwise directed. If you need a refill on your pain medication, please contact your pharmacy. They will contact our office to request authorization.  Prescriptions will not be filled after 5pm or on week-ends. You should follow a light diet the first few days after arrival home, such as soup and crackers, pudding, etc.unless your doctor has advised otherwise. A high-fiber, low fat diet can be resumed as tolerated.   Be sure to include lots of fluids daily. Most patients will experience some swelling and bruising on the chest and neck area.  Ice packs will help.  Swelling and bruising can take several days to resolve Most patients will experience some swelling and bruising in the area of the incision. Ice pack will help. Swelling and bruising can take several days to resolve..  It is common to experience some constipation if taking pain medication after surgery.  Increasing fluid intake and taking a stool softener will usually help or prevent this problem from occurring.  A mild laxative (Milk of Magnesia or Miralax) should be taken according to package directions if there are no bowel movements after 48 hours.  You may have steri-strips (small skin tapes) in place directly over the incision.  These strips should be left on the skin for 7-10 days.  If your surgeon used skin  glue on the incision, you may shower in 24 hours.  The glue will flake off over the next 2-3 weeks.  Any sutures or staples will be removed at the office during your follow-up visit. You may find that a light gauze bandage over your incision may keep your staples from being rubbed or pulled. You may shower and replace the bandage daily. ACTIVITIES:  You may resume regular (light) daily activities beginning the next day--such as daily self-care, walking, climbing stairs--gradually increasing activities as tolerated.  You may have sexual intercourse when it is comfortable.  Refrain from any heavy lifting or straining until approved by your doctor. You may drive when you no longer are taking prescription pain medication, you can comfortably wear a seatbelt, and you can safely maneuver your car and apply brakes Return to Work: ___________________________________ Derrick Hull should see your doctor in the office for a follow-up appointment approximately two weeks after your surgery.  Make sure that you call for this appointment within a day or two after you arrive home to insure a convenient appointment time. OTHER INSTRUCTIONS:  _____________________________________________________________ _____________________________________________________________  WHEN TO CALL YOUR DOCTOR: Fever over 101.0 Inability to urinate Nausea and/or vomiting Extreme swelling or bruising Continued bleeding from incision. Increased pain, redness, or drainage from the incision. Difficulty swallowing or breathing Muscle cramping or spasms. Numbness or tingling in hands or feet or around lips.  The clinic staff is available to answer your questions during regular business hours.  Please don't hesitate to call and ask to speak to one of  the nurses if you have concerns.  For further questions, please visit [email or web address] WOUND CARE: - midline dressing to be changed twice daily - supplies: sterile saline, gauze,  scissors, tape  - remove dressing and all packing carefully, moistening with sterile saline as needed to avoid packing/internal dressing sticking to the wound. - clean edges of skin around the wound with water/gauze, making sure there is no tape debris or leakage left on skin that could cause skin irritation or breakdown. - dampen and clean gauze with sterile saline and pack wound from wound base to skin level, making sure to take note of any possible areas of wound tracking, tunneling and packing appropriately. Wound can be packed loosely. Trim gauze to size if a whole gauze is not required. - cover wound with a dry gauze and secure with tape.  - write the date/time on the dry dressing/tape to better track when the last dressing change occurred. - change dressing as needed if leakage occurs, wound gets contaminated, or patient requests to shower. - patient may shower daily with wound open (i.e. remove all packing) and following the shower the wound should be dried and a clean dressing placed.    In a time of Crisis: [facility]  Mobile Crisis Management provides immediate crisis response, 24/7.  Call [phone]  [facility] for MH/DD/SA [facility] is available 24 hours a day, 7 days a week. Customer Service Specialists will assist you to find a crisis provider that is well-matched with your needs. Your local number is: [phone]  [facility] ([facility]) IOP, individual counseling, medication management [address] [place], [place] [address] [phone] Call for intake hours; Medicaid and Uninsured    Outpatient Providers  [facility] ([facility]) Group and individual counseling. [address]  [place], [place] [address] [phone] [place]: [phone]  [place]: [phone] Medicaid and uninsured.   [facility] Offers IOP groups multiple times per week. [address] [place]rian Mar[place], [place] [address] [phone] Takes Medicaid and other insurances.   [facility]  Chemical Dependency Intensive Outpatient Program (IOP) [address] [place], [place] [address] [phone] Takes Nurse, learning disability and [place].   [facility]  IOP and Partial Hospitalization Program  [address]  [place], [place] [address] [phone] Private Insurance, [place] only for partial hospitalization  [facility] [address], [address], [place], [place] [address] [phone] Monday-Friday. Short and Long term options. [facility] ([facility]) IOP, individual counseling, medication management [address] [place], [place] [address] [phone] Medicaid and [facility]  [facility] [address]  [place], [place] [address] [phone] Private Insurance and Self Pay   [facility] [address]  [place], [place] [address] [phone] Private Insurance, [place], and Self Pay   [facility]  [address] [place], [place] [address] [facility]  [address]  [place], [place] [address] [phone]  [facility]  [address] [place], [place] [address] [phone]      Residential Treatment Programs  [facility] [address] [place], [place] [address] [phone] or [phone] Detox and Residential Rehab 14 days (Medicare, Medicaid, private insurance, and self pay). No methadone. Call for pre-screen.   [facility]) [address]  [address]  [place], [place] [address] [phone] Detox (self Pay and Medicaid Limited availability) Rehab Only Male (Medicare, Medicaid, and Self Pay)-No methadone.  [facility] [address] [place], [place] [address] [phone] or [phone] Private Insurance only  [facility] [address] [place], [place] [address] Phone:  [phone] Must be detoxed 72 hours prior to admission; 28 day program.  Self-pay.  [facility] [address]  [place], [place] [phone] [employer], Medicare, [place] (not straight [place]). They offer assistance with transportation.   [facility] [address] [place],  [place], [place] [address] [phone] Christian Based Program. Men only. No insurance  [facility] [address] [place], [place] [address] Women's: [phone] Men's: [phone] No Medicaid.   [facility] [place] Locations across the [place] (mainly [place]) willing to help with transportation.  [phone] [employer]. [facility]  [address]  [place], [place] [address] [phone] Treatment Only, must make assessment appointment, and must be sober for assessment appointment. Self pay, Medicare A and B, [facility], must be [facility] resident. No methadone.   [facility]  [address] [place], [place] [address] [phone] No pending legal charges, Long-term work program. No methadone. Call for assessment.  [facility]  [address], [place], [place] [address] [phone] or [phone] Commercial Insurance Only  [facility] - [phone] Private Insurance (no [place]). Males/Females, call to make referrals, multiple facilities   [facility] [address]  [place], [place] [address]  [phone] Men Only Upfront Fee   [facility]-no methadone [facility] [address] [place], [place] [address] [phone] (f)         Syringe Services Program: Due to COVID-19, syringe services programs are likely operating under different hours with limited  or no fixed site hours. Some programs may not be operating at all. Please contact the program directly using the phone numbers provided below to see if they are still operating under COVID-19.  [facility] Solution to the Opioid Problem ([facility]STOP) Fixed; mobile; peer-based Roxy Cedar [phone] [email or web address] Fixed site exchange at [facility], [address] [place], [place] [address] on Wednesdays (2:00 - 5:00 pm) and Thursdays (4:00 - 8:00 pm). Pop-up mobile exchange locations: [employer], [address] [place], [place] [address] on Tuesdays (11:00 am - 1:00 pm) and Fridays (11:00 am - 1:00 pm)  -[facility] - [address], [place], [place] [address] on Tuesdays (2:00 - 4:00 pm) and Fridays (2:00 - 4:00 pm)  -[facility] copy - also serves Radio broadcast assistant and [employer] [place] [employer]; Fixed; mobile; peer-based; [place] [phone] [email or web address] [address], [place], [place] [address] Delivery and outreach available in [place] and [place], please call for more information. Monday, Tuesday: 1:00 -7:00 pm, Thursday: 4:00 pm - 8:00 pm or Friday: 1:00 pm - 8:00 pm  Medication-Assisted Treatment (MAT):  -[facility]- services [address] and surrounding areas including [place], [place], [place], [place], [address], [place], [place], [place], [place], and [place], [place]. Options include Methadone, buprenorphine or Suboxone. [address], [place], [place] [address] Phone: [phone] Mon - Fri: 5:30am - 2:00pm Sat: 5:30am -7:30am Sun: Closed Holidays: 6:00am - 8:00am  -[facility]- We use FDA-approved medications, like methadone/suboxone/sublocade, and vivitrol. These medications are then combined with customized  care plans that include individual or group counseling, toxicology, and medical care directed by on-site physicians. Accepts most  insurance plans, Medicaid, and private pay.  [address] [place], [place] [address] Phone: [phone] Monday-Friday 5:00 AM - 10:00 AM Saturday 6:00 AM - 8:30 AM Sunday 6:00 AM - 7:00 AM  -[facility]- [facility] is a treatment & recovery focused program. In addition to receiving methadone medication, our clients participate in individual and group counseling as well as random drug testing. If accepted into the [facility] Opioid Program, you will be provided several intake appointments and a physical exam [address] [place], [place] [address] Office: [phone]  Fax: [phone]  -[facility]- We put our community members at the center of everything we do, for remote treatment services as well as in-person, from alcohol withdrawal to opioid use and more.  [address], [place], [place] [address] [phone] Monday-Wednesday: 9:00am - 5:00pm Thursday: 9:00am - 6:00pm Friday: 9:00am - 5:00pm Saturday: 9:00am - 1:00pm Sunday: Closed  -[facility] [place]) [address], [place], [place] [address] [phone]  [place] [phone] [address] [place], [place] [address]  M-W    5:00am-12:00pm Thu     5:00am-10:00am Fri       5:00am-12:00pm Sat      5:00am-8:00am Sun     Closed  $12/daily for Methadone Treatment.

## 2023-02-05 NOTE — Progress Notes (Signed)
Inpatient Rehab Admissions Coordinator:   CIR consult received; however, since consult was placed PT recommended no follow up and OT recommended HH. Pt. States he prefers to d/c directly home without coming to CIR as well. States his girlfriend can be with him 24/7 and friends can assist him in getting into his 3rd floor apartments. CIR will sign off.   Megan Salon, MS, CCC-SLP Rehab Admissions Coordinator  [phone] (celll) [phone] (office)
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
## 2023-02-05 NOTE — TOC Progression Note (Signed)
Transition of Care [facility]) - Progression Note    Patient Details  Name: Derrick Hull MRN: [id number] Date of Birth: 1981-03-30  [facility] CM/SW Contact  Mearl Latin, LCSW Phone Number: 02/05/2023, 12:22 PM  Clinical Narrative:    CSW received substance use consult. Patient out of room transferring to different unit. Community resources placed on AVS for follow up.    Expected Discharge Plan:  (TBD) Barriers to Discharge: Continued Medical Work up  Expected Discharge Plan and Services In-house Referral: Clinical Social Work                                             Social Determinants of Health (SDOH) Interventions    Readmission Risk Interventions     No data to display
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
## 2023-02-06 LAB — GLUCOSE, CAPILLARY
Glucose-Capillary: 106 mg/dL — ABNORMAL HIGH (ref 70–99)
Glucose-Capillary: 112 mg/dL — ABNORMAL HIGH (ref 70–99)
Glucose-Capillary: 119 mg/dL — ABNORMAL HIGH (ref 70–99)
Glucose-Capillary: 119 mg/dL — ABNORMAL HIGH (ref 70–99)

## 2023-02-06 MED ORDER — DOCUSATE SODIUM 50 MG/5ML PO LIQD
100.0000 mg | Freq: Two times a day (BID) | ORAL | Status: DC
  Filled 2023-02-06 (×2): qty 10

## 2023-02-06 MED ORDER — POLYETHYLENE GLYCOL 3350 17 G PO PACK
17.0000 g | PACK | Freq: Every day | ORAL | Status: DC
  Filled 2023-02-06: qty 1

## 2023-02-06 MED ORDER — LOSARTAN POTASSIUM 50 MG PO TABS
100.0000 mg | ORAL_TABLET | Freq: Every day | ORAL | Status: DC
  Administered 2023-02-07: 100 mg via ORAL
  Filled 2023-02-06: qty 2

## 2023-02-06 MED ORDER — CLONAZEPAM 0.5 MG PO TABS
0.5000 mg | ORAL_TABLET | Freq: Two times a day (BID) | ORAL | Status: DC
  Administered 2023-02-06 – 2023-02-07 (×2): 0.5 mg via ORAL
  Filled 2023-02-06 (×2): qty 1

## 2023-02-06 MED ORDER — METHOCARBAMOL 500 MG PO TABS
1000.0000 mg | ORAL_TABLET | Freq: Three times a day (TID) | ORAL | Status: DC
  Administered 2023-02-06 – 2023-02-07 (×3): 1000 mg via ORAL
  Filled 2023-02-06 (×3): qty 2

## 2023-02-06 MED ORDER — HYDROCHLOROTHIAZIDE 25 MG PO TABS
25.0000 mg | ORAL_TABLET | Freq: Every day | ORAL | Status: DC
  Administered 2023-02-07: 25 mg via ORAL
  Filled 2023-02-06: qty 1

## 2023-02-06 MED ORDER — OXYCODONE HCL 5 MG PO TABS
5.0000 mg | ORAL_TABLET | Freq: Four times a day (QID) | ORAL | Status: DC
  Administered 2023-02-06 – 2023-02-07 (×4): 5 mg via ORAL
  Filled 2023-02-06 (×5): qty 1
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
## 2023-02-06 NOTE — Progress Notes (Signed)
Incentive Spirometer provided and patient agrees to use it.  Wound care done. Latoya, states she is wife, is at bedside and is receptive to learning. She watched the wound care being done and took some photos.  She states that she feels comfortable doing it after discharge.  Wound looks good - pink, minimal drainage.
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
## 2023-02-07 ENCOUNTER — Other Ambulatory Visit (HOSPITAL_COMMUNITY): Payer: Self-pay

## 2023-02-07 MED ORDER — ACETAMINOPHEN 500 MG PO TABS
1000.0000 mg | ORAL_TABLET | Freq: Four times a day (QID) | ORAL | Status: DC
  Administered 2023-02-07: 1000 mg via ORAL

## 2023-02-07 MED ORDER — OXYCODONE HCL 5 MG PO TABS: 5.0000 mg | ORAL_TABLET | ORAL | Status: DC | PRN

## 2023-02-07 MED ORDER — AMLODIPINE BESYLATE 10 MG PO TABS
10.0000 mg | ORAL_TABLET | Freq: Every day | ORAL | Status: DC
  Administered 2023-02-07: 10 mg via ORAL

## 2023-02-07 MED ORDER — METHOCARBAMOL 500 MG PO TABS
500.0000 mg | ORAL_TABLET | Freq: Three times a day (TID) | ORAL | 0 refills | Status: AC | PRN
  Filled 2023-02-07: qty 60, 10d supply, fill #0

## 2023-02-07 MED ORDER — AMLODIPINE BESYLATE 10 MG PO TABS
10.0000 mg | ORAL_TABLET | Freq: Every day | ORAL | 0 refills | Status: DC
  Filled 2023-02-07: qty 20, 20d supply, fill #0

## 2023-02-07 MED ORDER — QUETIAPINE FUMARATE 25 MG PO TABS
50.0000 mg | ORAL_TABLET | Freq: Two times a day (BID) | ORAL | Status: DC
  Administered 2023-02-07: 50 mg via ORAL

## 2023-02-07 MED ORDER — OXYCODONE HCL 5 MG PO TABS
5.0000 mg | ORAL_TABLET | ORAL | 0 refills | Status: DC | PRN
  Filled 2023-02-07: qty 30, 3d supply, fill #0

## 2023-02-07 MED ORDER — CARVEDILOL 25 MG PO TABS
25.0000 mg | ORAL_TABLET | Freq: Two times a day (BID) | ORAL | Status: DC
  Administered 2023-02-07: 25 mg via ORAL

## 2023-02-07 MED ORDER — ACETAMINOPHEN 500 MG PO TABS: 1000.0000 mg | ORAL_TABLET | Freq: Four times a day (QID) | ORAL | Status: AC | PRN

## 2023-02-07 MED ORDER — TAMSULOSIN HCL 0.4 MG PO CAPS
0.4000 mg | ORAL_CAPSULE | Freq: Every day | ORAL | 0 refills | Status: DC
  Filled 2023-02-07: qty 5, 5d supply, fill #0
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
## 2023-02-07 NOTE — Care Plan (Signed)
Patient discharged to self/ family care. Patient to stay with his brother. All discharge instructions including follow-up appointments and medication regimen explained to patient. PIV to R FA removed. Patient left unit via wheelchair down to main lobby. All personal belongings and discharge medications taken with patient.

## 2023-02-07 NOTE — Plan of Care (Signed)

## 2023-02-08 ENCOUNTER — Encounter (HOSPITAL_COMMUNITY): Payer: Self-pay

## 2023-02-19 ENCOUNTER — Other Ambulatory Visit: Payer: Self-pay

## 2023-02-19 ENCOUNTER — Encounter: Payer: Self-pay | Admitting: Student

## 2023-02-19 ENCOUNTER — Ambulatory Visit (INDEPENDENT_AMBULATORY_CARE_PROVIDER_SITE_OTHER): Payer: Medicaid Other | Admitting: Student

## 2023-02-19 VITALS — BP 156/94 | HR 92 | Temp 98.6°F | Ht 74.0 in | Wt 255.0 lb

## 2023-02-19 DIAGNOSIS — Z9889 Other specified postprocedural states: Secondary | ICD-10-CM | POA: Diagnosis not present

## 2023-02-19 DIAGNOSIS — I10 Essential (primary) hypertension: Secondary | ICD-10-CM

## 2023-02-19 DIAGNOSIS — W3400XA Accidental discharge from unspecified firearms or gun, initial encounter: Secondary | ICD-10-CM

## 2023-02-19 MED ORDER — LOSARTAN POTASSIUM-HCTZ 100-25 MG PO TABS
1.0000 | ORAL_TABLET | Freq: Every day | ORAL | 0 refills | Status: AC
Start: 2023-02-19 — End: 2024-02-19

## 2023-02-19 MED ORDER — AMLODIPINE BESYLATE 10 MG PO TABS: 10.0000 mg | ORAL_TABLET | Freq: Every day | ORAL | 0 refills | Status: AC

## 2023-02-19 MED ORDER — ASPIRIN 81 MG PO TBEC: 81.0000 mg | DELAYED_RELEASE_TABLET | Freq: Every day | ORAL | 0 refills | Status: AC | PRN
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
## 2023-02-19 NOTE — Patient Instructions (Signed)
Please contact [facility] general surgery at : [phone] for a follow up.   If you need assistance with setting up a PCP in [place], please call us at [phone]

## 2023-02-19 NOTE — Assessment & Plan Note (Signed)
Patient has a history of HTN. His current regimen includes amlodipine 10mg  and losartan 100-hydrochlorothiazide 25mg  once a day.  PLAN: -Refill medications -BMP repeated today -Needs PCP follow up in [place] for closer monitoring/control

## 2023-02-20 NOTE — Progress Notes (Signed)
Called patient on the phone and notified him of his lab results. He is aware that he has an elevated platelet count. I advised him to become established with a primary care physician in [place] and consider repeating a CBC in 2-4 weeks.  Patient notified that the BMP is stable.

## 2023-02-24 NOTE — Progress Notes (Signed)
Internal Medicine Clinic Attending  I was physically present during the key portions of the resident provided service and participated in the medical decision making of patient's management care. I reviewed pertinent patient test results.  The assessment, diagnosis, and plan were formulated together and I agree with the documentation in the resident's note.  Patient is moving back to [place].  Advised him to contact [facility] in [place] for surgical and medical follow up.  If he returns to [facility], he can be seen for BP follow up.   Inez Catalina, MD

## 2023-05-30 ENCOUNTER — Other Ambulatory Visit (HOSPITAL_BASED_OUTPATIENT_CLINIC_OR_DEPARTMENT_OTHER): Payer: Self-pay

## 2023-08-07 IMAGING — CR DG CHEST 2V
2 series · 2 of 2 positions shown · non-contrast
Comparison: Chest radiograph dated 05/16/2020.

CLINICAL DATA: Fever and cough.

EXAM:
CHEST - 2 VIEW

[w chest pa]
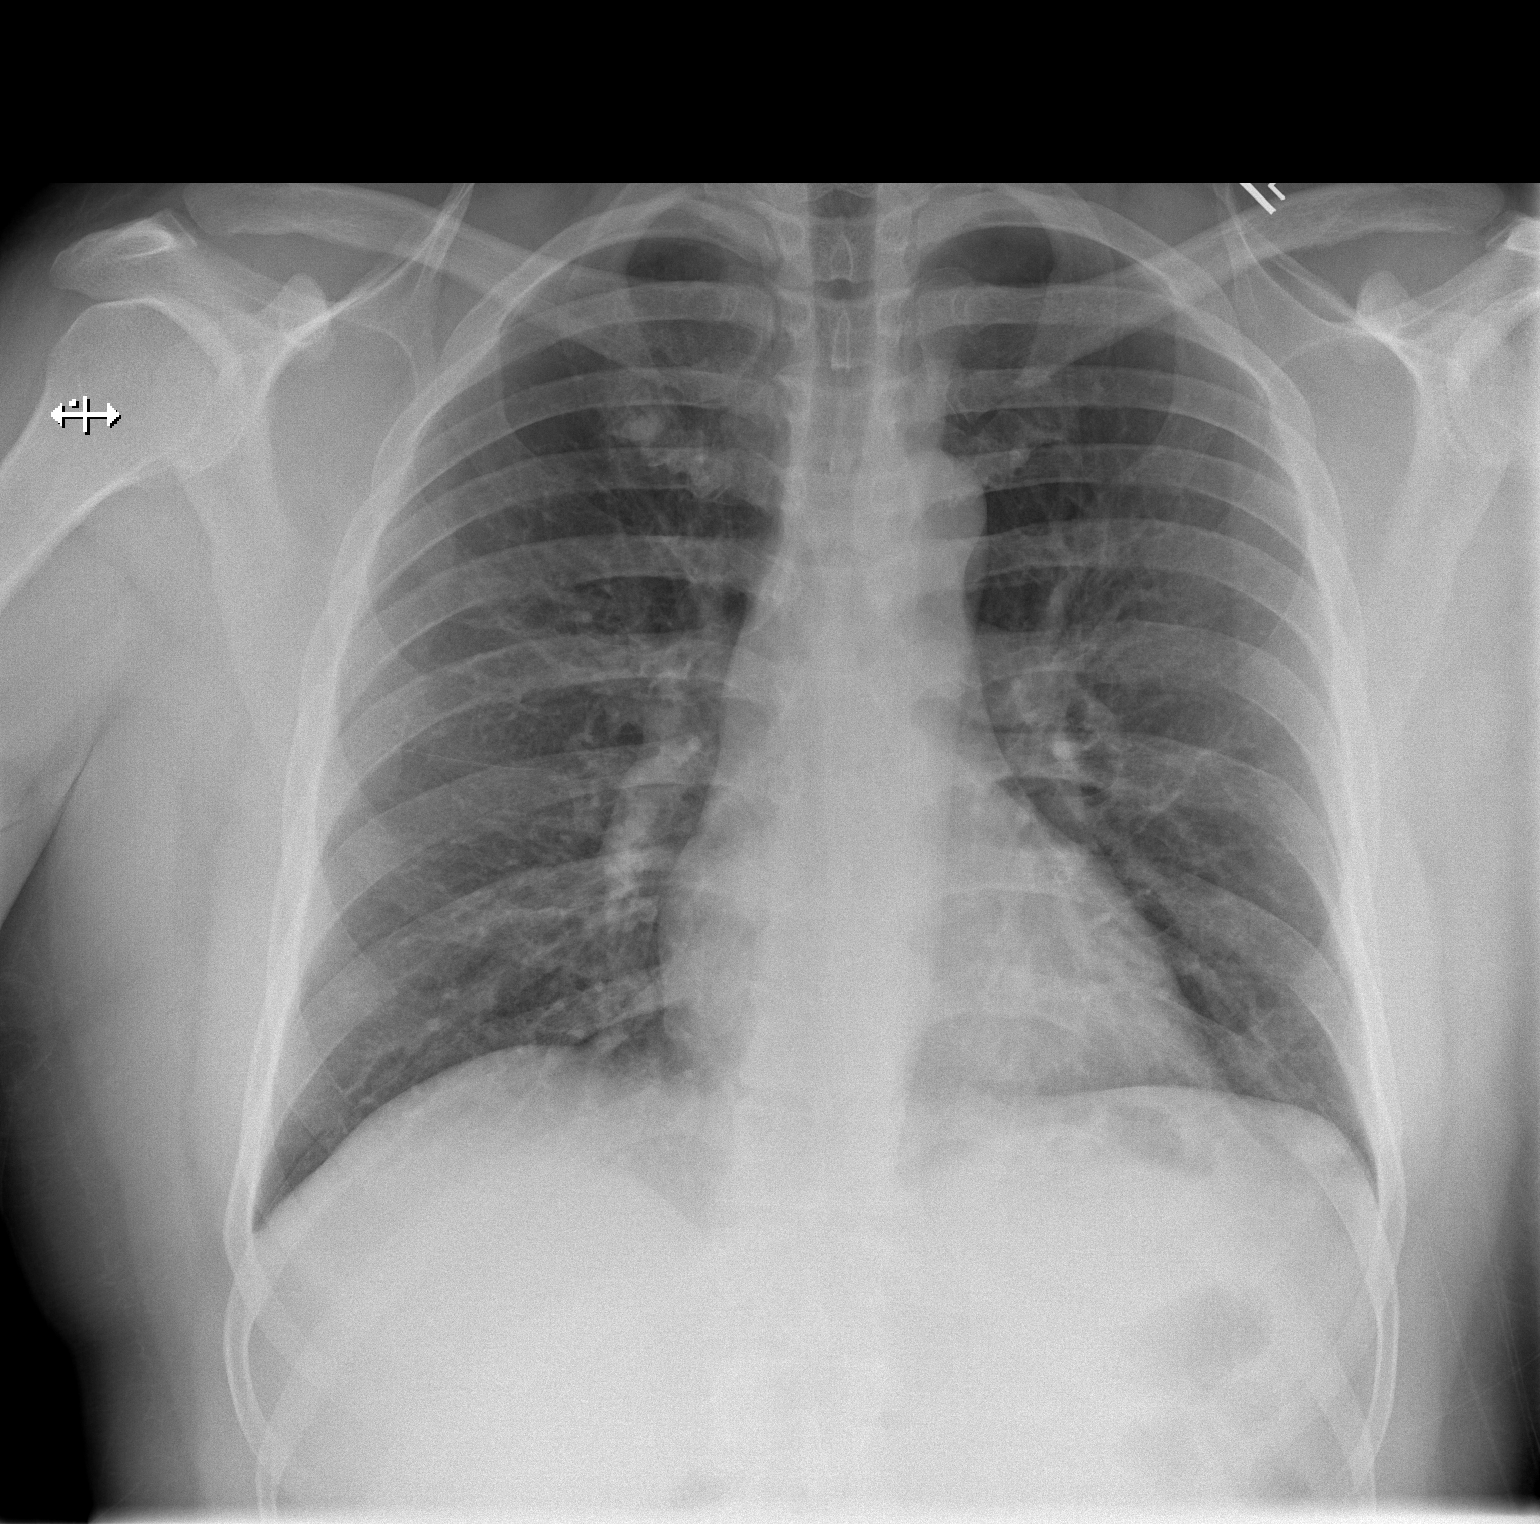

[w chest lat]
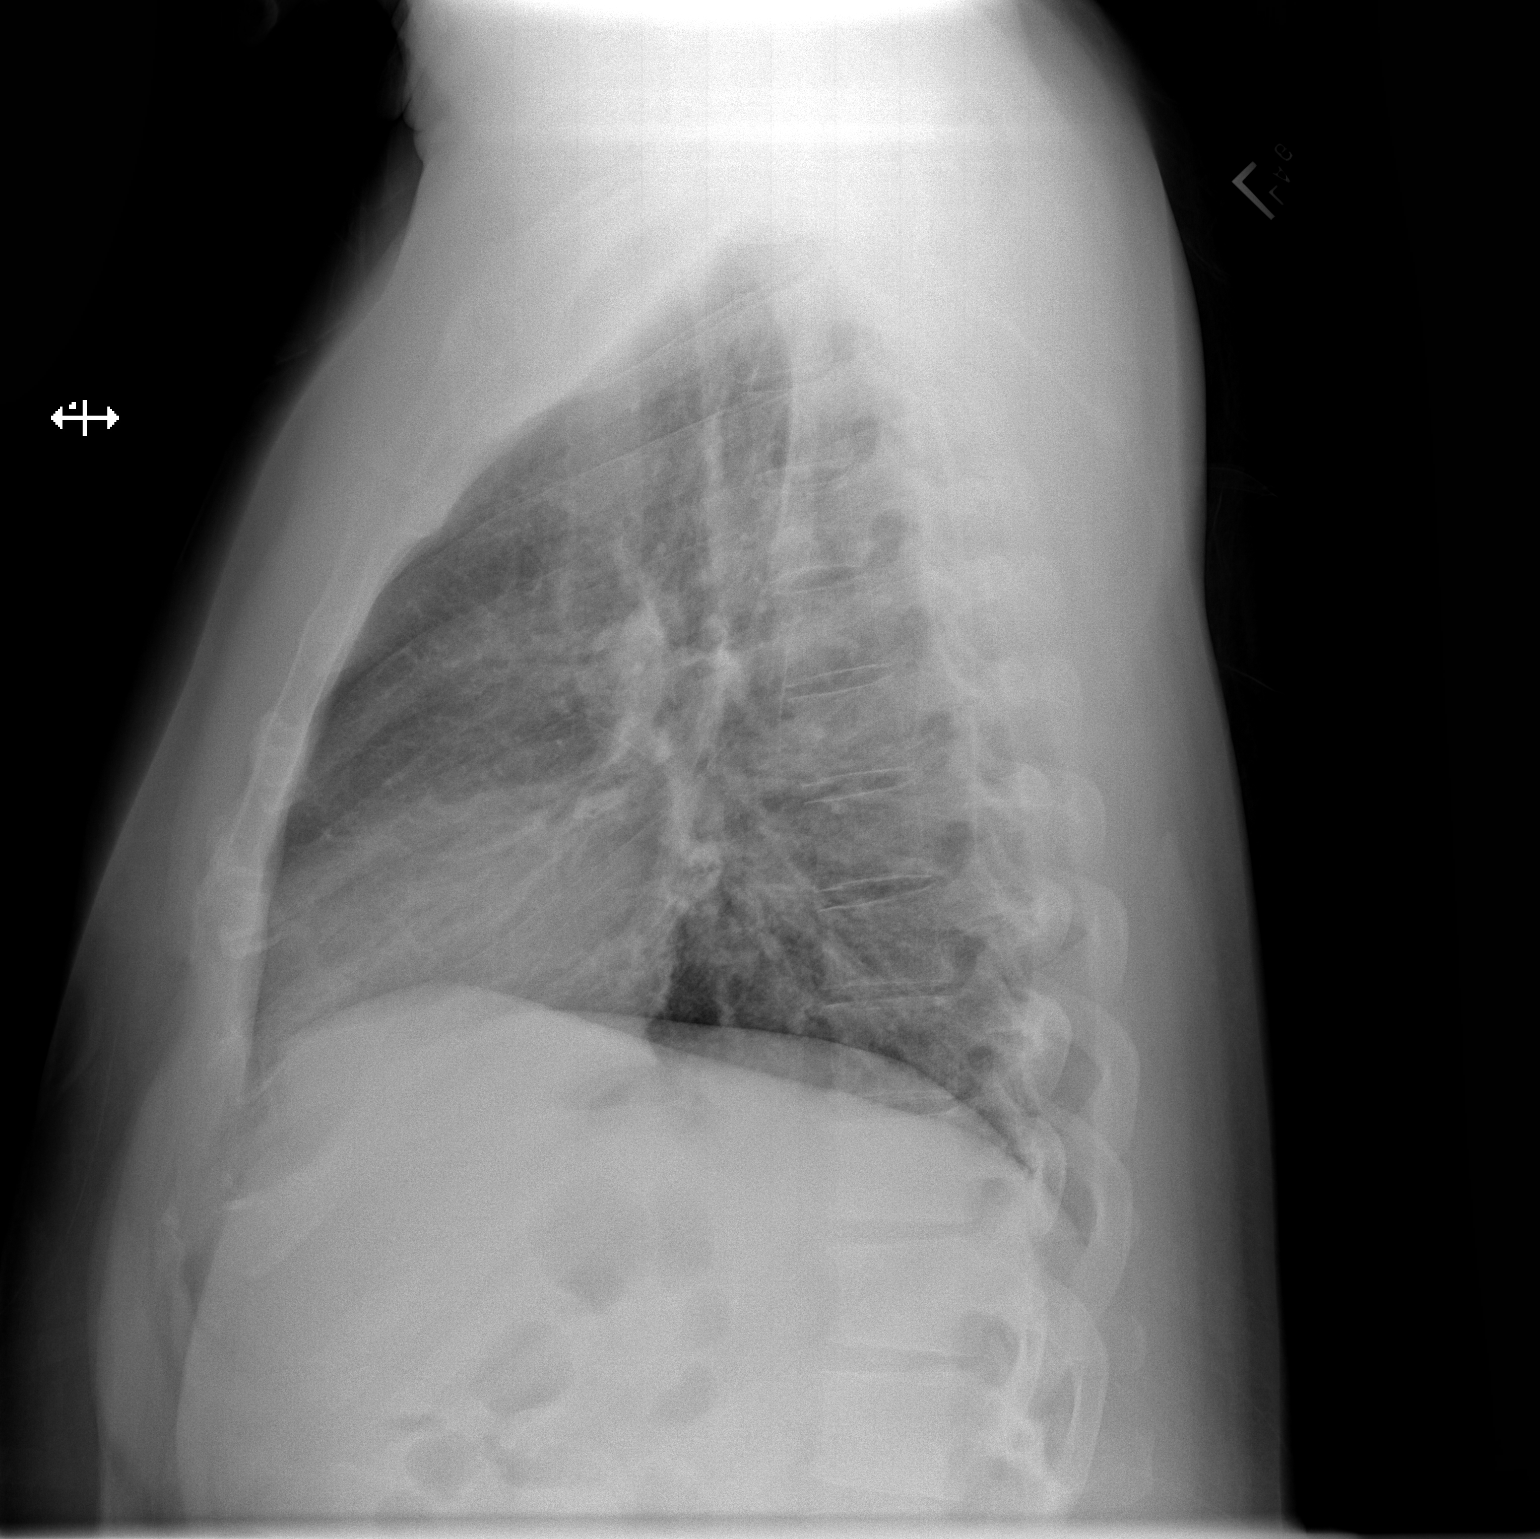

[2 of 2 positions shown; findings below may reference images not displayed]

FINDINGS: The heart size and mediastinal contours are within normal limits.
Both lungs are clear. The visualized skeletal structures are
unremarkable.
IMPRESSION: No active cardiopulmonary disease.
